# Patient Record
Sex: Female | Born: 1950 | Race: White | Hispanic: No | Smoking: Never smoker
Health system: Southern US, Community
[De-identification: ages and names within clinical notes are randomized; demographics above are authoritative.]

## PROBLEM LIST (undated history)

## (undated) DIAGNOSIS — R0600 Dyspnea, unspecified: Secondary | ICD-10-CM

## (undated) DIAGNOSIS — F329 Major depressive disorder, single episode, unspecified: Secondary | ICD-10-CM

## (undated) DIAGNOSIS — E119 Type 2 diabetes mellitus without complications: Secondary | ICD-10-CM

## (undated) DIAGNOSIS — R609 Edema, unspecified: Secondary | ICD-10-CM

## (undated) DIAGNOSIS — I10 Essential (primary) hypertension: Secondary | ICD-10-CM

## (undated) DIAGNOSIS — R011 Cardiac murmur, unspecified: Secondary | ICD-10-CM

## (undated) DIAGNOSIS — R233 Spontaneous ecchymoses: Secondary | ICD-10-CM

## (undated) DIAGNOSIS — K5190 Ulcerative colitis, unspecified, without complications: Secondary | ICD-10-CM

## (undated) DIAGNOSIS — R238 Other skin changes: Secondary | ICD-10-CM

## (undated) DIAGNOSIS — K529 Noninfective gastroenteritis and colitis, unspecified: Secondary | ICD-10-CM

## (undated) DIAGNOSIS — I82409 Acute embolism and thrombosis of unspecified deep veins of unspecified lower extremity: Secondary | ICD-10-CM

## (undated) DIAGNOSIS — Z86718 Personal history of other venous thrombosis and embolism: Secondary | ICD-10-CM

## (undated) DIAGNOSIS — N179 Acute kidney failure, unspecified: Secondary | ICD-10-CM

## (undated) DIAGNOSIS — M1990 Unspecified osteoarthritis, unspecified site: Secondary | ICD-10-CM

## (undated) DIAGNOSIS — F32A Depression, unspecified: Secondary | ICD-10-CM

## (undated) DIAGNOSIS — E7800 Pure hypercholesterolemia, unspecified: Secondary | ICD-10-CM

## (undated) DIAGNOSIS — R0989 Other specified symptoms and signs involving the circulatory and respiratory systems: Secondary | ICD-10-CM

## (undated) HISTORY — PX: BREAST BIOPSY: SHX20

## (undated) HISTORY — DX: Major depressive disorder, single episode, unspecified: F32.9

## (undated) HISTORY — PX: EYE SURGERY: SHX253

## (undated) HISTORY — DX: Unspecified osteoarthritis, unspecified site: M19.90

## (undated) HISTORY — PX: TONSILLECTOMY: SUR1361

## (undated) HISTORY — PX: OTHER SURGICAL HISTORY: SHX169

## (undated) HISTORY — DX: Type 2 diabetes mellitus without complications: E11.9

## (undated) HISTORY — DX: Depression, unspecified: F32.A

## (undated) HISTORY — DX: Spontaneous ecchymoses: R23.3

## (undated) HISTORY — DX: Edema, unspecified: R60.9

## (undated) HISTORY — DX: Personal history of other venous thrombosis and embolism: Z86.718

## (undated) HISTORY — PX: TUBAL LIGATION: SHX77

## (undated) HISTORY — PX: POSTERIOR FUSION LUMBAR SPINE: SUR632

## (undated) HISTORY — PX: LAMINECTOMY: SHX219

## (undated) HISTORY — DX: Essential (primary) hypertension: I10

## (undated) HISTORY — DX: Noninfective gastroenteritis and colitis, unspecified: K52.9

## (undated) HISTORY — DX: Other skin changes: R23.8

## (undated) HISTORY — PX: TOTAL HIP ARTHROPLASTY: SHX124

## (undated) HISTORY — PX: ANKLE FRACTURE SURGERY: SHX122

## (undated) HISTORY — PX: BACK SURGERY: SHX140

## (undated) HISTORY — DX: Other specified symptoms and signs involving the circulatory and respiratory systems: R09.89

---

## 2004-04-17 ENCOUNTER — Other Ambulatory Visit: Payer: Self-pay

## 2005-07-06 ENCOUNTER — Ambulatory Visit: Payer: Self-pay | Admitting: Internal Medicine

## 2005-07-12 ENCOUNTER — Ambulatory Visit: Payer: Self-pay

## 2005-11-08 ENCOUNTER — Emergency Department: Payer: Self-pay | Admitting: Unknown Physician Specialty

## 2006-07-19 ENCOUNTER — Ambulatory Visit: Payer: Self-pay | Admitting: Internal Medicine

## 2007-07-20 ENCOUNTER — Ambulatory Visit: Payer: Self-pay | Admitting: Internal Medicine

## 2007-07-25 ENCOUNTER — Ambulatory Visit: Payer: Self-pay | Admitting: Internal Medicine

## 2008-04-18 ENCOUNTER — Ambulatory Visit: Payer: Self-pay | Admitting: Internal Medicine

## 2008-07-26 ENCOUNTER — Ambulatory Visit: Payer: Self-pay | Admitting: Internal Medicine

## 2009-01-14 ENCOUNTER — Ambulatory Visit: Payer: Self-pay | Admitting: Unknown Physician Specialty

## 2009-02-03 ENCOUNTER — Ambulatory Visit: Payer: Self-pay | Admitting: Unknown Physician Specialty

## 2009-03-21 ENCOUNTER — Ambulatory Visit: Payer: Self-pay | Admitting: Unknown Physician Specialty

## 2009-04-05 ENCOUNTER — Ambulatory Visit: Payer: Self-pay | Admitting: Unknown Physician Specialty

## 2009-08-07 ENCOUNTER — Ambulatory Visit: Payer: Self-pay | Admitting: Internal Medicine

## 2009-08-13 ENCOUNTER — Ambulatory Visit: Payer: Self-pay | Admitting: Internal Medicine

## 2009-09-18 ENCOUNTER — Ambulatory Visit: Payer: Self-pay | Admitting: Surgery

## 2010-02-25 ENCOUNTER — Ambulatory Visit: Payer: Self-pay | Admitting: Surgery

## 2010-02-26 ENCOUNTER — Ambulatory Visit: Payer: Self-pay | Admitting: Unknown Physician Specialty

## 2010-03-06 ENCOUNTER — Ambulatory Visit: Payer: Self-pay | Admitting: Unknown Physician Specialty

## 2010-09-07 ENCOUNTER — Ambulatory Visit: Payer: Self-pay | Admitting: Internal Medicine

## 2011-02-04 ENCOUNTER — Ambulatory Visit: Payer: Self-pay | Admitting: Unknown Physician Specialty

## 2011-10-19 ENCOUNTER — Ambulatory Visit: Payer: Self-pay

## 2011-11-04 ENCOUNTER — Ambulatory Visit: Payer: Self-pay | Admitting: Pain Medicine

## 2011-11-22 ENCOUNTER — Ambulatory Visit: Payer: Self-pay | Admitting: Pain Medicine

## 2011-11-25 ENCOUNTER — Ambulatory Visit: Payer: Self-pay | Admitting: Pain Medicine

## 2012-02-17 ENCOUNTER — Ambulatory Visit: Payer: Self-pay | Admitting: Internal Medicine

## 2012-02-24 ENCOUNTER — Ambulatory Visit: Payer: Self-pay | Admitting: Internal Medicine

## 2012-04-28 DIAGNOSIS — M1610 Unilateral primary osteoarthritis, unspecified hip: Secondary | ICD-10-CM | POA: Insufficient documentation

## 2012-05-01 ENCOUNTER — Ambulatory Visit: Payer: Self-pay | Admitting: Unknown Physician Specialty

## 2012-05-03 LAB — PATHOLOGY REPORT

## 2012-05-31 DIAGNOSIS — M6281 Muscle weakness (generalized): Secondary | ICD-10-CM | POA: Insufficient documentation

## 2012-05-31 DIAGNOSIS — M5414 Radiculopathy, thoracic region: Secondary | ICD-10-CM | POA: Insufficient documentation

## 2012-05-31 DIAGNOSIS — R262 Difficulty in walking, not elsewhere classified: Secondary | ICD-10-CM | POA: Insufficient documentation

## 2012-06-20 DIAGNOSIS — M48061 Spinal stenosis, lumbar region without neurogenic claudication: Secondary | ICD-10-CM | POA: Insufficient documentation

## 2012-06-23 ENCOUNTER — Ambulatory Visit: Payer: Self-pay | Admitting: Physical Medicine & Rehabilitation

## 2012-07-11 DIAGNOSIS — M51379 Other intervertebral disc degeneration, lumbosacral region without mention of lumbar back pain or lower extremity pain: Secondary | ICD-10-CM | POA: Insufficient documentation

## 2012-07-11 DIAGNOSIS — M5137 Other intervertebral disc degeneration, lumbosacral region: Secondary | ICD-10-CM | POA: Insufficient documentation

## 2012-09-25 ENCOUNTER — Ambulatory Visit: Payer: Self-pay | Admitting: Internal Medicine

## 2012-10-09 ENCOUNTER — Ambulatory Visit: Payer: Self-pay | Admitting: Orthopedic Surgery

## 2012-10-25 DIAGNOSIS — M4310 Spondylolisthesis, site unspecified: Secondary | ICD-10-CM | POA: Insufficient documentation

## 2012-11-17 ENCOUNTER — Encounter: Payer: Self-pay | Admitting: Internal Medicine

## 2012-11-23 LAB — BASIC METABOLIC PANEL
Anion Gap: 7 (ref 7–16)
Chloride: 104 mmol/L (ref 98–107)
Co2: 25 mmol/L (ref 21–32)
EGFR (Non-African Amer.): 54 — ABNORMAL LOW
Glucose: 149 mg/dL — ABNORMAL HIGH (ref 65–99)
Osmolality: 277 (ref 275–301)
Potassium: 3.9 mmol/L (ref 3.5–5.1)
Sodium: 136 mmol/L (ref 136–145)

## 2012-12-06 ENCOUNTER — Encounter: Payer: Self-pay | Admitting: Internal Medicine

## 2012-12-24 LAB — COMPREHENSIVE METABOLIC PANEL
Alkaline Phosphatase: 140 U/L — ABNORMAL HIGH (ref 50–136)
Anion Gap: 8 (ref 7–16)
BUN: 36 mg/dL — ABNORMAL HIGH (ref 7–18)
Calcium, Total: 9 mg/dL (ref 8.5–10.1)
Chloride: 99 mmol/L (ref 98–107)
Co2: 25 mmol/L (ref 21–32)
Creatinine: 2.72 mg/dL — ABNORMAL HIGH (ref 0.60–1.30)
EGFR (Non-African Amer.): 18 — ABNORMAL LOW
Glucose: 137 mg/dL — ABNORMAL HIGH (ref 65–99)
Osmolality: 275 (ref 275–301)
Potassium: 4.5 mmol/L (ref 3.5–5.1)
SGPT (ALT): 14 U/L (ref 12–78)
Sodium: 132 mmol/L — ABNORMAL LOW (ref 136–145)
Total Protein: 7.6 g/dL (ref 6.4–8.2)

## 2012-12-24 LAB — URINALYSIS, COMPLETE
Blood: NEGATIVE
Hyaline Cast: 5
Ketone: NEGATIVE
Nitrite: NEGATIVE
Ph: 5 (ref 4.5–8.0)
Specific Gravity: 1.014 (ref 1.003–1.030)
Squamous Epithelial: 11
WBC UR: 39 /HPF (ref 0–5)

## 2012-12-24 LAB — CBC
MCHC: 31.6 g/dL — ABNORMAL LOW (ref 32.0–36.0)
MCV: 86 fL (ref 80–100)
RBC: 3.72 10*6/uL — ABNORMAL LOW (ref 3.80–5.20)
RDW: 15.5 % — ABNORMAL HIGH (ref 11.5–14.5)

## 2012-12-24 LAB — TROPONIN I: Troponin-I: <0.02 ng/mL

## 2012-12-25 ENCOUNTER — Inpatient Hospital Stay: Payer: Self-pay | Admitting: Internal Medicine

## 2012-12-25 LAB — BASIC METABOLIC PANEL
Anion Gap: 11 (ref 7–16)
BUN: 33 mg/dL — ABNORMAL HIGH (ref 7–18)
Chloride: 103 mmol/L (ref 98–107)
Co2: 21 mmol/L (ref 21–32)
EGFR (African American): 30 — ABNORMAL LOW
Glucose: 164 mg/dL — ABNORMAL HIGH (ref 65–99)
Potassium: 4.5 mmol/L (ref 3.5–5.1)
Sodium: 135 mmol/L — ABNORMAL LOW (ref 136–145)

## 2012-12-25 LAB — SODIUM, URINE, RANDOM: Sodium, Urine Random: 44 mmol/L (ref 20–110)

## 2012-12-26 LAB — BASIC METABOLIC PANEL
Anion Gap: 9 (ref 7–16)
Calcium, Total: 8.6 mg/dL (ref 8.5–10.1)
Co2: 22 mmol/L (ref 21–32)
Creatinine: 1.08 mg/dL (ref 0.60–1.30)
EGFR (African American): >60
Glucose: 110 mg/dL — ABNORMAL HIGH (ref 65–99)
Potassium: 4.1 mmol/L (ref 3.5–5.1)

## 2012-12-26 LAB — CBC WITH DIFFERENTIAL/PLATELET
Basophil %: 0.2 %
Eosinophil #: 0.4 10*3/uL (ref 0.0–0.7)
Eosinophil %: 6.5 %
HCT: 28.8 % — ABNORMAL LOW (ref 35.0–47.0)
HGB: 9.5 g/dL — ABNORMAL LOW (ref 12.0–16.0)
Lymphocyte %: 27.8 %
MCHC: 32.9 g/dL (ref 32.0–36.0)
Monocyte #: 0.8 x10 3/mm (ref 0.2–0.9)
Monocyte %: 13.4 %
Neutrophil #: 3.1 10*3/uL (ref 1.4–6.5)
Platelet: 321 10*3/uL (ref 150–440)

## 2012-12-26 LAB — URINE CULTURE

## 2012-12-27 LAB — CBC WITH DIFFERENTIAL/PLATELET
Basophil #: 0 10*3/uL (ref 0.0–0.1)
Basophil %: 0.3 %
Eosinophil #: 0.6 10*3/uL (ref 0.0–0.7)
Eosinophil %: 8.3 %
Lymphocyte #: 2.3 10*3/uL (ref 1.0–3.6)
MCH: 28.2 pg (ref 26.0–34.0)
MCHC: 33.5 g/dL (ref 32.0–36.0)
MCV: 84 fL (ref 80–100)
Monocyte %: 11.2 %
RDW: 15.5 % — ABNORMAL HIGH (ref 11.5–14.5)

## 2012-12-27 LAB — BASIC METABOLIC PANEL
Anion Gap: 9 (ref 7–16)
Chloride: 109 mmol/L — ABNORMAL HIGH (ref 98–107)
Co2: 23 mmol/L (ref 21–32)
EGFR (African American): >60
EGFR (Non-African Amer.): >60
Osmolality: 280 (ref 275–301)
Potassium: 3.8 mmol/L (ref 3.5–5.1)
Sodium: 141 mmol/L (ref 136–145)

## 2012-12-28 ENCOUNTER — Encounter: Payer: Self-pay | Admitting: Internal Medicine

## 2012-12-30 LAB — CULTURE, BLOOD (SINGLE)

## 2013-01-06 ENCOUNTER — Encounter: Payer: Self-pay | Admitting: Internal Medicine

## 2013-01-17 ENCOUNTER — Inpatient Hospital Stay: Payer: Self-pay | Admitting: Internal Medicine

## 2013-01-17 LAB — COMPREHENSIVE METABOLIC PANEL
Albumin: 3.3 g/dL — ABNORMAL LOW (ref 3.4–5.0)
Alkaline Phosphatase: 161 U/L — ABNORMAL HIGH (ref 50–136)
BUN: 18 mg/dL (ref 7–18)
Bilirubin,Total: 0.3 mg/dL (ref 0.2–1.0)
Calcium, Total: 9.5 mg/dL (ref 8.5–10.1)
Chloride: 94 mmol/L — ABNORMAL LOW (ref 98–107)
Co2: 26 mmol/L (ref 21–32)
Creatinine: 1.34 mg/dL — ABNORMAL HIGH (ref 0.60–1.30)
EGFR (African American): 49 — ABNORMAL LOW
Glucose: 119 mg/dL — ABNORMAL HIGH (ref 65–99)
Osmolality: 262 (ref 275–301)
Potassium: 4 mmol/L (ref 3.5–5.1)
SGPT (ALT): 15 U/L (ref 12–78)
Total Protein: 8.7 g/dL — ABNORMAL HIGH (ref 6.4–8.2)

## 2013-01-17 LAB — URINALYSIS, COMPLETE
Bilirubin,UR: NEGATIVE
Glucose,UR: NEGATIVE mg/dL (ref 0–75)
Leukocyte Esterase: NEGATIVE
Nitrite: NEGATIVE
Ph: 5 (ref 4.5–8.0)
Protein: NEGATIVE

## 2013-01-17 LAB — DRUG SCREEN, URINE
MDMA (Ecstasy)Ur Screen: POSITIVE (ref ?–500)
Opiate, Ur Screen: POSITIVE (ref ?–300)
Phencyclidine (PCP) Ur S: NEGATIVE (ref ?–25)

## 2013-01-17 LAB — CK TOTAL AND CKMB (NOT AT ARMC): CK-MB: 1.1 ng/mL (ref 0.5–3.6)

## 2013-01-17 LAB — CBC
HCT: 33 % — ABNORMAL LOW (ref 35.0–47.0)
HGB: 10.7 g/dL — ABNORMAL LOW (ref 12.0–16.0)
MCHC: 32.2 g/dL (ref 32.0–36.0)
MCV: 83 fL (ref 80–100)
Platelet: 322 10*3/uL (ref 150–440)
RBC: 3.99 10*6/uL (ref 3.80–5.20)
RDW: 16.6 % — ABNORMAL HIGH (ref 11.5–14.5)
WBC: 8.8 10*3/uL (ref 3.6–11.0)

## 2013-01-17 LAB — TROPONIN I: Troponin-I: <0.02 ng/mL

## 2013-01-18 LAB — CBC WITH DIFFERENTIAL/PLATELET
Basophil #: 0.1 10*3/uL (ref 0.0–0.1)
Basophil %: 1.2 %
Eosinophil #: 0.7 10*3/uL (ref 0.0–0.7)
Eosinophil %: 12.3 %
HCT: 28.4 % — ABNORMAL LOW (ref 35.0–47.0)
Lymphocyte #: 2.2 10*3/uL (ref 1.0–3.6)
Lymphocyte %: 36.6 %
MCH: 26.5 pg (ref 26.0–34.0)
MCV: 83 fL (ref 80–100)
Monocyte #: 0.7 x10 3/mm (ref 0.2–0.9)
Monocyte %: 12.4 %
Neutrophil #: 2.3 10*3/uL (ref 1.4–6.5)
Neutrophil %: 37.5 %
RDW: 17 % — ABNORMAL HIGH (ref 11.5–14.5)
WBC: 6 10*3/uL (ref 3.6–11.0)

## 2013-01-18 LAB — IRON AND TIBC
Iron Bind.Cap.(Total): 179 ug/dL — ABNORMAL LOW (ref 250–450)
Iron Saturation: 15 %
Unbound Iron-Bind.Cap.: 152 ug/dL

## 2013-01-18 LAB — BASIC METABOLIC PANEL
Anion Gap: 7 (ref 7–16)
Calcium, Total: 9 mg/dL (ref 8.5–10.1)
Chloride: 108 mmol/L — ABNORMAL HIGH (ref 98–107)
Co2: 25 mmol/L (ref 21–32)
Creatinine: 0.92 mg/dL (ref 0.60–1.30)
Glucose: 116 mg/dL — ABNORMAL HIGH (ref 65–99)
Osmolality: 280 (ref 275–301)
Potassium: 4.5 mmol/L (ref 3.5–5.1)
Sodium: 140 mmol/L (ref 136–145)

## 2013-01-18 LAB — RETICULOCYTES: Reticulocyte: 1.44 % (ref 0.5–2.2)

## 2013-01-18 LAB — MAGNESIUM: Magnesium: 1.5 mg/dL — ABNORMAL LOW

## 2013-01-19 LAB — BASIC METABOLIC PANEL
Co2: 25 mmol/L (ref 21–32)
Creatinine: 0.77 mg/dL (ref 0.60–1.30)
EGFR (African American): >60
EGFR (Non-African Amer.): >60
Osmolality: 279 (ref 275–301)
Potassium: 4.2 mmol/L (ref 3.5–5.1)
Sodium: 138 mmol/L (ref 136–145)

## 2013-01-19 LAB — HEMATOCRIT: HCT: 33.7 % — ABNORMAL LOW (ref 35.0–47.0)

## 2013-01-20 LAB — CBC WITH DIFFERENTIAL/PLATELET
Basophil #: 0.1 10*3/uL (ref 0.0–0.1)
Eosinophil %: 4.7 %
HCT: 33.8 % — ABNORMAL LOW (ref 35.0–47.0)
HGB: 11 g/dL — ABNORMAL LOW (ref 12.0–16.0)
Lymphocyte #: 2.8 10*3/uL (ref 1.0–3.6)
Lymphocyte %: 41 %
MCH: 26.6 pg (ref 26.0–34.0)
MCHC: 32.5 g/dL (ref 32.0–36.0)
Monocyte #: 0.6 x10 3/mm (ref 0.2–0.9)
Neutrophil %: 45.5 %
Platelet: 338 10*3/uL (ref 150–440)
RDW: 16.9 % — ABNORMAL HIGH (ref 11.5–14.5)
WBC: 6.9 10*3/uL (ref 3.6–11.0)

## 2013-01-20 LAB — BASIC METABOLIC PANEL
Anion Gap: 11 (ref 7–16)
BUN: 7 mg/dL (ref 7–18)
Calcium, Total: 9.7 mg/dL (ref 8.5–10.1)
Chloride: 103 mmol/L (ref 98–107)
Co2: 25 mmol/L (ref 21–32)
Glucose: 205 mg/dL — ABNORMAL HIGH (ref 65–99)
Osmolality: 281 (ref 275–301)

## 2013-01-22 LAB — BASIC METABOLIC PANEL
BUN: 11 mg/dL (ref 7–18)
Creatinine: 1.06 mg/dL (ref 0.60–1.30)
EGFR (African American): >60
EGFR (Non-African Amer.): 56 — ABNORMAL LOW
Glucose: 130 mg/dL — ABNORMAL HIGH (ref 65–99)
Osmolality: 277 (ref 275–301)
Sodium: 138 mmol/L (ref 136–145)

## 2013-01-22 LAB — CBC WITH DIFFERENTIAL/PLATELET
Basophil #: 0.1 10*3/uL (ref 0.0–0.1)
Basophil %: 1.1 %
Eosinophil #: 0.7 10*3/uL (ref 0.0–0.7)
Eosinophil %: 7.5 %
HGB: 10.4 g/dL — ABNORMAL LOW (ref 12.0–16.0)
MCH: 26.3 pg (ref 26.0–34.0)
MCHC: 32.2 g/dL (ref 32.0–36.0)
Monocyte %: 10.2 %
Platelet: 311 10*3/uL (ref 150–440)
RDW: 17 % — ABNORMAL HIGH (ref 11.5–14.5)

## 2013-02-03 ENCOUNTER — Encounter: Payer: Self-pay | Admitting: Internal Medicine

## 2013-03-06 ENCOUNTER — Encounter: Payer: Self-pay | Admitting: Internal Medicine

## 2013-03-20 DIAGNOSIS — T8579XA Infection and inflammatory reaction due to other internal prosthetic devices, implants and grafts, initial encounter: Secondary | ICD-10-CM | POA: Insufficient documentation

## 2013-03-25 ENCOUNTER — Emergency Department: Payer: Self-pay | Admitting: Emergency Medicine

## 2013-04-11 DIAGNOSIS — E7800 Pure hypercholesterolemia, unspecified: Secondary | ICD-10-CM | POA: Insufficient documentation

## 2013-04-11 DIAGNOSIS — I10 Essential (primary) hypertension: Secondary | ICD-10-CM | POA: Insufficient documentation

## 2013-05-25 ENCOUNTER — Ambulatory Visit: Payer: Self-pay | Admitting: Internal Medicine

## 2013-06-20 ENCOUNTER — Emergency Department: Payer: Self-pay | Admitting: Emergency Medicine

## 2013-06-20 LAB — COMPREHENSIVE METABOLIC PANEL
Albumin: 3.2 g/dL — ABNORMAL LOW (ref 3.4–5.0)
Anion Gap: 7 (ref 7–16)
BUN: 18 mg/dL (ref 7–18)
Bilirubin,Total: 0.3 mg/dL (ref 0.2–1.0)
Calcium, Total: 8.7 mg/dL (ref 8.5–10.1)
Co2: 23 mmol/L (ref 21–32)
Creatinine: 1.42 mg/dL — ABNORMAL HIGH (ref 0.60–1.30)
EGFR (African American): 46 — ABNORMAL LOW
Glucose: 229 mg/dL — ABNORMAL HIGH (ref 65–99)
Osmolality: 279 (ref 275–301)
Potassium: 4.3 mmol/L (ref 3.5–5.1)
SGOT(AST): 13 U/L — ABNORMAL LOW (ref 15–37)
SGPT (ALT): 13 U/L (ref 12–78)
Sodium: 135 mmol/L — ABNORMAL LOW (ref 136–145)

## 2013-06-20 LAB — URINALYSIS, COMPLETE
Bacteria: NONE SEEN
Protein: NEGATIVE
Specific Gravity: 1.012 (ref 1.003–1.030)
Squamous Epithelial: <1

## 2013-06-20 LAB — CBC
HCT: 33.8 % — ABNORMAL LOW (ref 35.0–47.0)
MCH: 27.8 pg (ref 26.0–34.0)
MCHC: 31.8 g/dL — ABNORMAL LOW (ref 32.0–36.0)
Platelet: 284 10*3/uL (ref 150–440)
RBC: 3.87 10*6/uL (ref 3.80–5.20)
RDW: 16.1 % — ABNORMAL HIGH (ref 11.5–14.5)
WBC: 14.9 10*3/uL — ABNORMAL HIGH (ref 3.6–11.0)

## 2013-06-20 LAB — TROPONIN I: Troponin-I: <0.02 ng/mL

## 2013-06-26 LAB — CULTURE, BLOOD (SINGLE)

## 2013-06-27 ENCOUNTER — Encounter: Payer: Self-pay | Admitting: Internal Medicine

## 2013-06-28 LAB — PROTIME-INR: INR: 1

## 2013-06-28 LAB — BASIC METABOLIC PANEL
Anion Gap: 6 — ABNORMAL LOW (ref 7–16)
BUN: 7 mg/dL (ref 7–18)
Calcium, Total: 8.5 mg/dL (ref 8.5–10.1)
Chloride: 104 mmol/L (ref 98–107)
Co2: 31 mmol/L (ref 21–32)
Creatinine: 1.31 mg/dL — ABNORMAL HIGH (ref 0.60–1.30)
EGFR (Non-African Amer.): 44 — ABNORMAL LOW
Osmolality: 280 (ref 275–301)
Potassium: 3.4 mmol/L — ABNORMAL LOW (ref 3.5–5.1)
Sodium: 141 mmol/L (ref 136–145)

## 2013-07-02 LAB — CBC WITH DIFFERENTIAL/PLATELET
Basophil #: 0.1 10*3/uL (ref 0.0–0.1)
Eosinophil #: 1.2 10*3/uL — ABNORMAL HIGH (ref 0.0–0.7)
HCT: 28.1 % — ABNORMAL LOW (ref 35.0–47.0)
HGB: 9.3 g/dL — ABNORMAL LOW (ref 12.0–16.0)
Lymphocyte %: 29 %
MCH: 29.4 pg (ref 26.0–34.0)
MCHC: 33 g/dL (ref 32.0–36.0)
MCV: 89 fL (ref 80–100)
Monocyte #: 0.4 x10 3/mm (ref 0.2–0.9)
Neutrophil #: 4.8 10*3/uL (ref 1.4–6.5)
Neutrophil %: 52.1 %
RBC: 3.15 10*6/uL — ABNORMAL LOW (ref 3.80–5.20)
RDW: 16.4 % — ABNORMAL HIGH (ref 11.5–14.5)
WBC: 9.2 10*3/uL (ref 3.6–11.0)

## 2013-07-02 LAB — BASIC METABOLIC PANEL
Anion Gap: 5 — ABNORMAL LOW (ref 7–16)
BUN: 14 mg/dL (ref 7–18)
Calcium, Total: 9 mg/dL (ref 8.5–10.1)
Chloride: 108 mmol/L — ABNORMAL HIGH (ref 98–107)
Co2: 28 mmol/L (ref 21–32)
Creatinine: 1.39 mg/dL — ABNORMAL HIGH (ref 0.60–1.30)
EGFR (Non-African Amer.): 41 — ABNORMAL LOW
Glucose: 145 mg/dL — ABNORMAL HIGH (ref 65–99)
Osmolality: 284 (ref 275–301)

## 2013-07-02 LAB — VANCOMYCIN, TROUGH: Vancomycin, Trough: 23 ug/mL (ref 10–20)

## 2013-07-05 LAB — BASIC METABOLIC PANEL
Anion Gap: 4 — ABNORMAL LOW (ref 7–16)
BUN: 14 mg/dL (ref 7–18)
Calcium, Total: 8.9 mg/dL (ref 8.5–10.1)
Co2: 29 mmol/L (ref 21–32)
Creatinine: 1.33 mg/dL — ABNORMAL HIGH (ref 0.60–1.30)
EGFR (African American): 50 — ABNORMAL LOW
Potassium: 4 mmol/L (ref 3.5–5.1)

## 2013-07-06 ENCOUNTER — Encounter: Payer: Self-pay | Admitting: Internal Medicine

## 2013-07-06 LAB — VANCOMYCIN, TROUGH: Vancomycin, Trough: 19 ug/mL (ref 10–20)

## 2013-07-09 LAB — BASIC METABOLIC PANEL
Anion Gap: 7 (ref 7–16)
Chloride: 105 mmol/L (ref 98–107)
Co2: 26 mmol/L (ref 21–32)
Creatinine: 1.21 mg/dL (ref 0.60–1.30)
EGFR (African American): 56 — ABNORMAL LOW
EGFR (Non-African Amer.): 48 — ABNORMAL LOW
Potassium: 3.8 mmol/L (ref 3.5–5.1)

## 2013-07-09 LAB — CBC WITH DIFFERENTIAL/PLATELET
HCT: 25.7 % — ABNORMAL LOW (ref 35.0–47.0)
Lymphocyte #: 2.4 10*3/uL (ref 1.0–3.6)
Lymphocyte %: 35.3 %
MCH: 30.3 pg (ref 26.0–34.0)
Monocyte #: 0.5 x10 3/mm (ref 0.2–0.9)
Neutrophil #: 2.8 10*3/uL (ref 1.4–6.5)
Neutrophil %: 41.7 %
RBC: 2.88 10*6/uL — ABNORMAL LOW (ref 3.80–5.20)
RDW: 16.8 % — ABNORMAL HIGH (ref 11.5–14.5)
WBC: 6.7 10*3/uL (ref 3.6–11.0)

## 2013-07-09 LAB — PROTIME-INR: Prothrombin Time: 24.2 secs — ABNORMAL HIGH (ref 11.5–14.7)

## 2013-07-09 LAB — SEDIMENTATION RATE: Erythrocyte Sed Rate: 52 mm/hr — ABNORMAL HIGH (ref 0–30)

## 2013-07-11 LAB — BASIC METABOLIC PANEL
Chloride: 106 mmol/L (ref 98–107)
Co2: 24 mmol/L (ref 21–32)
Creatinine: 1.16 mg/dL (ref 0.60–1.30)
EGFR (African American): 58 — ABNORMAL LOW
EGFR (Non-African Amer.): 50 — ABNORMAL LOW
Glucose: 213 mg/dL — ABNORMAL HIGH (ref 65–99)
Osmolality: 283 (ref 275–301)
Potassium: 3.9 mmol/L (ref 3.5–5.1)
Sodium: 138 mmol/L (ref 136–145)

## 2013-07-11 LAB — SEDIMENTATION RATE: Erythrocyte Sed Rate: 98 mm/hr — ABNORMAL HIGH (ref 0–30)

## 2013-07-11 LAB — CBC WITH DIFFERENTIAL/PLATELET
Basophil %: 1.2 %
Eosinophil #: 1 10*3/uL — ABNORMAL HIGH (ref 0.0–0.7)
Eosinophil %: 15.5 %
HGB: 7.5 g/dL — ABNORMAL LOW (ref 12.0–16.0)
Lymphocyte #: 1.6 10*3/uL (ref 1.0–3.6)
Lymphocyte %: 24.1 %
MCHC: 33.5 g/dL (ref 32.0–36.0)
MCV: 90 fL (ref 80–100)
Monocyte #: 0.6 x10 3/mm (ref 0.2–0.9)
Monocyte %: 9.1 %
Platelet: 237 10*3/uL (ref 150–440)
RBC: 2.5 10*6/uL — ABNORMAL LOW (ref 3.80–5.20)
RDW: 17 % — ABNORMAL HIGH (ref 11.5–14.5)
WBC: 6.7 10*3/uL (ref 3.6–11.0)

## 2013-07-11 LAB — VANCOMYCIN, TROUGH: Vancomycin, Trough: 18 ug/mL (ref 10–20)

## 2013-07-12 LAB — BASIC METABOLIC PANEL
Anion Gap: 6 — ABNORMAL LOW (ref 7–16)
Creatinine: 1.26 mg/dL (ref 0.60–1.30)
Glucose: 160 mg/dL — ABNORMAL HIGH (ref 65–99)
Osmolality: 280 (ref 275–301)
Potassium: 4 mmol/L (ref 3.5–5.1)
Sodium: 138 mmol/L (ref 136–145)

## 2013-07-14 LAB — CBC WITH DIFFERENTIAL/PLATELET
Eosinophil #: 1 10*3/uL — ABNORMAL HIGH (ref 0.0–0.7)
Eosinophil %: 15.8 %
HCT: 28.6 % — ABNORMAL LOW (ref 35.0–47.0)
HGB: 9.5 g/dL — ABNORMAL LOW (ref 12.0–16.0)
Lymphocyte #: 1.6 10*3/uL (ref 1.0–3.6)
Lymphocyte %: 26.5 %
MCV: 90 fL (ref 80–100)
Monocyte #: 0.5 x10 3/mm (ref 0.2–0.9)
Monocyte %: 7.9 %
Neutrophil #: 2.9 10*3/uL (ref 1.4–6.5)
Neutrophil %: 48.8 %
Platelet: 207 10*3/uL (ref 150–440)

## 2013-07-16 LAB — PROTIME-INR: INR: 2

## 2013-07-16 LAB — CBC WITH DIFFERENTIAL/PLATELET
Basophil %: 0.9 %
Eosinophil #: 1.1 10*3/uL — ABNORMAL HIGH (ref 0.0–0.7)
Eosinophil %: 16.8 %
HCT: 28.4 % — ABNORMAL LOW (ref 35.0–47.0)
HGB: 9.5 g/dL — ABNORMAL LOW (ref 12.0–16.0)
Lymphocyte %: 25.4 %
MCV: 90 fL (ref 80–100)
Monocyte %: 10.6 %
Neutrophil %: 46.3 %
Platelet: 174 10*3/uL (ref 150–440)
RBC: 3.17 10*6/uL — ABNORMAL LOW (ref 3.80–5.20)
RDW: 16.6 % — ABNORMAL HIGH (ref 11.5–14.5)
WBC: 6.4 10*3/uL (ref 3.6–11.0)

## 2013-07-19 LAB — BASIC METABOLIC PANEL
BUN: 16 mg/dL (ref 7–18)
Calcium, Total: 8.3 mg/dL — ABNORMAL LOW (ref 8.5–10.1)
Chloride: 106 mmol/L (ref 98–107)
Osmolality: 281 (ref 275–301)
Potassium: 3.7 mmol/L (ref 3.5–5.1)
Sodium: 139 mmol/L (ref 136–145)

## 2013-07-19 LAB — CREATININE, SERUM: EGFR (African American): 57 — ABNORMAL LOW

## 2013-07-23 LAB — VANCOMYCIN, TROUGH: Vancomycin, Trough: 15 ug/mL (ref 10–20)

## 2013-07-24 LAB — PROTIME-INR: INR: 3.3

## 2013-07-31 LAB — PROTIME-INR: INR: 4.4

## 2013-08-02 LAB — PROTIME-INR
INR: 2.5
Prothrombin Time: 26.7 secs — ABNORMAL HIGH (ref 11.5–14.7)

## 2013-08-06 ENCOUNTER — Encounter: Payer: Self-pay | Admitting: Internal Medicine

## 2013-12-07 ENCOUNTER — Emergency Department: Payer: Self-pay | Admitting: Emergency Medicine

## 2014-04-04 DIAGNOSIS — I80209 Phlebitis and thrombophlebitis of unspecified deep vessels of unspecified lower extremity: Secondary | ICD-10-CM | POA: Insufficient documentation

## 2014-04-18 DIAGNOSIS — S42213A Unspecified displaced fracture of surgical neck of unspecified humerus, initial encounter for closed fracture: Secondary | ICD-10-CM | POA: Insufficient documentation

## 2014-05-13 DIAGNOSIS — Z9889 Other specified postprocedural states: Secondary | ICD-10-CM | POA: Insufficient documentation

## 2014-05-30 ENCOUNTER — Ambulatory Visit: Payer: Self-pay | Admitting: Internal Medicine

## 2014-07-30 ENCOUNTER — Ambulatory Visit: Payer: Self-pay

## 2014-08-04 IMAGING — CR DG CHEST 1V PORT
1 series · 1 of 1 positions shown · non-contrast
Comparison: none

REASON FOR EXAM: post picc line placement
COMMENTS:

PROCEDURE:     DXR - DXR PORTABLE CHEST SINGLE VIEW  - March 26, 2013  [DATE]
RESULT:     Comparison is made to prior study dated 03/25/2013.

[ap]
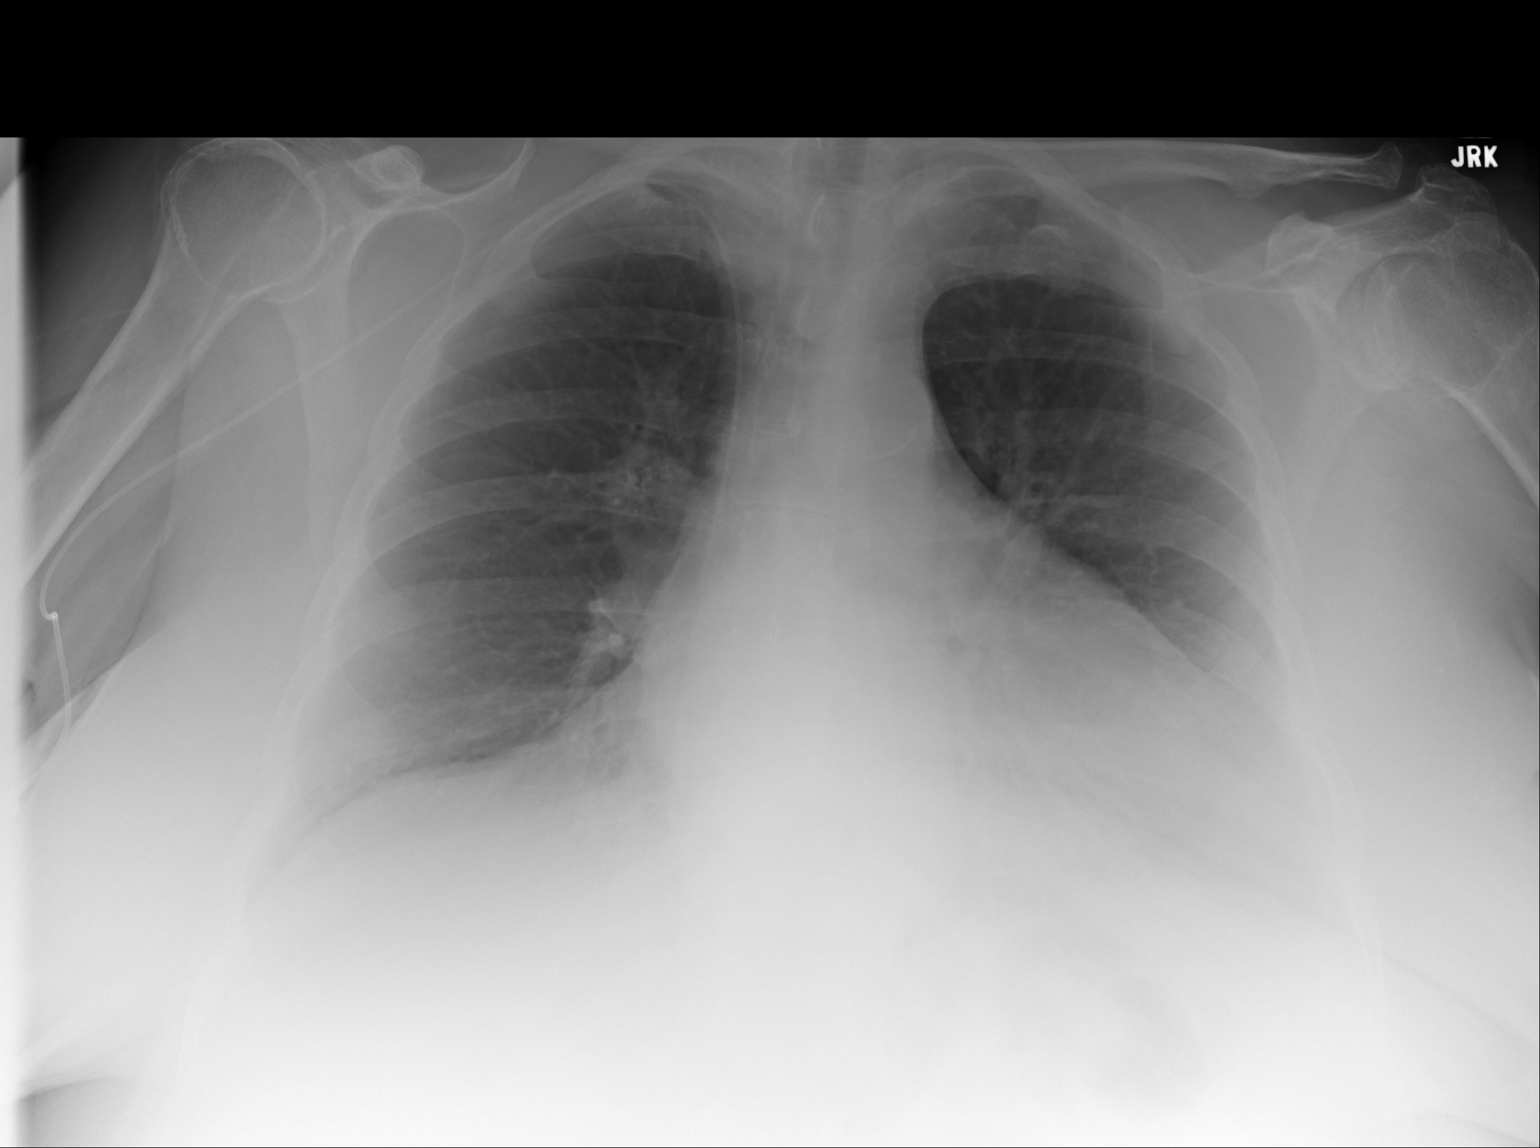

[1 of 1 positions shown; findings below may reference images not displayed]

FINDINGS: A right-sided central venous catheter is appreciated with tip
projecting in the region of the superior vena cava right atrial junction.
There is no evidence of pneumothorax. When compared to previous study there
is otherwise no significant change. There is no evidence of focal regions of
consolidation or focal infiltrates. The cardiac silhouette is moderately
enlarged. The visualized bony skeleton is unremarkable.
IMPRESSION: 1. Shallow inspiration.
2. Right-sided central venous catheter as described above. There is no
evidence of a pneumothorax.

## 2014-08-20 ENCOUNTER — Emergency Department: Payer: Self-pay | Admitting: Emergency Medicine

## 2014-08-20 LAB — URINALYSIS, COMPLETE
BLOOD: NEGATIVE
Bacteria: NONE SEEN
Bilirubin,UR: NEGATIVE
Glucose,UR: NEGATIVE mg/dL (ref 0–75)
Hyaline Cast: 4
Ketone: NEGATIVE
NITRITE: NEGATIVE
Ph: 5 (ref 4.5–8.0)
Protein: 30
Specific Gravity: 1.014 (ref 1.003–1.030)

## 2014-08-20 LAB — CBC
HCT: 37.6 % (ref 35.0–47.0)
HGB: 12.3 g/dL (ref 12.0–16.0)
MCH: 30.3 pg (ref 26.0–34.0)
MCHC: 32.8 g/dL (ref 32.0–36.0)
MCV: 92 fL (ref 80–100)
PLATELETS: 163 10*3/uL (ref 150–440)
RBC: 4.07 10*6/uL (ref 3.80–5.20)
RDW: 13.4 % (ref 11.5–14.5)
WBC: 7.4 10*3/uL (ref 3.6–11.0)

## 2014-08-20 LAB — COMPREHENSIVE METABOLIC PANEL
ALK PHOS: 121 U/L — AB
ALT: 27 U/L
Albumin: 3.2 g/dL — ABNORMAL LOW (ref 3.4–5.0)
Anion Gap: 12 (ref 7–16)
BUN: 45 mg/dL — ABNORMAL HIGH (ref 7–18)
Bilirubin,Total: 0.4 mg/dL (ref 0.2–1.0)
Calcium, Total: 8.5 mg/dL (ref 8.5–10.1)
Chloride: 104 mmol/L (ref 98–107)
Co2: 21 mmol/L (ref 21–32)
Creatinine: 1.92 mg/dL — ABNORMAL HIGH (ref 0.60–1.30)
EGFR (African American): 32 — ABNORMAL LOW
GFR CALC NON AF AMER: 27 — AB
GLUCOSE: 152 mg/dL — AB (ref 65–99)
OSMOLALITY: 288 (ref 275–301)
Potassium: 4.3 mmol/L (ref 3.5–5.1)
SGOT(AST): 24 U/L (ref 15–37)
SODIUM: 137 mmol/L (ref 136–145)
Total Protein: 7.2 g/dL (ref 6.4–8.2)

## 2014-08-20 LAB — PROTIME-INR
INR: 1.9
Prothrombin Time: 21 secs — ABNORMAL HIGH (ref 11.5–14.7)

## 2014-08-20 LAB — TROPONIN I: Troponin-I: <0.02 ng/mL

## 2015-03-28 NOTE — Discharge Summary (Signed)
PATIENT NAME:  Alexis Vaughan, Alexis Vaughan MR#:  [id number] DATE OF BIRTH:  07/25/1951  DATE OF ADMISSION:  12/25/2012  DATE OF DISCHARGE:  12/28/2012  FINAL DIAGNOSES: 1.  Acute renal failure secondary to dehydration.  2.  Accidental overdose of oxycodone and OxyContin.  3.  Right foot fracture, remains in boot, activity limited secondary to this.  4.  Osteoarthritis with severe back pain and disk disease, with impending surgery.  5.  Diverticulosis.  6.  Adult onset diabetes mellitus, insulin-requiring.  7.  Hypertension.  8.  Hyperlipidemia.  9.  History of right hip replacement.   HISTORY AND PHYSICAL: Please see dictated admission history and physical.   HOSPITAL COURSE: The patient was admitted with evidence of altered mental status. She was found to have evidence of acute renal failure and dehydration, which is thought to be causing the above. She also had taken more of her OxyContin than she was supposed to, it sounds like the dose had been changed, and the patient had become confused. In addition, she was found to have mild urinary tract infection. She was placed on IV fluids, and her renal function returned to normal. Her medications were initially held and then slowly reintroduced, and her mental status had cleared. She received a course of antibiotic treatment during her hospitalization and did not require this further.   She is limited in mobility secondary to her right foot fracture. She is currently in a boot, which she is allowed to take off when she is at rest, but she must use it when she is weight-bearing. Podiatry saw her here, and films were repeated, which showed the fracture, which does appear to be healing. She really has limits in her activity, unfortunately, secondary to severe pain in her right back and buttock from her degenerative disk disease. She has been seen by neurosurgery, and they have talked about surgical intervention for this. She has a followup with neurosurgery coming  up tomorrow, and they will readdress this in light of her foot fracture, with plan for her to proceed to surgery if this is what is felt is the next step, versus potentially revisiting physiatry to try to get some pain relief.   Her course has been more complicated by, unfortunately, the fact that her husband has been very ill, and was hospitalized himself; at this point, he is going for further evaluation for potentially a life-threatening illness, and this anxiety has contributed to the above. She has history of anxiety and depression already, followed by Dr. Maryruth Bun, psychiatrist; Dr. Maryruth Bun was gracious enough to see her during this hospitalization, medications were adjusted, and this did seem to help.   Due to the fact that she still is able to go only about 20 feet due to the pain and due to balance issues, mobility issues, as well as to help monitor medications, it was felt that she would do better returning to rehabilitation, so a bed search was undertaken. A bed became available, and the patient will be discharged to the nursing facility in stable condition with her physical activity to be up as tolerated with the boot on the right foot, although, again, she may remove this when she is nonweightbearing or lying in bed. Physical therapy and occupational therapy should evaluate and treat the patient. She will need her sugars checked b.i.d. with a physician called for more than 250 or less than 70. She should keep the followup with neurosurgery with Dr. Loistine Simas on Friday, January 24.   DISCHARGE  MEDICATIONS: 1.  Multivitamin 1 p.o. daily.  2. Remicade intravenously every 6 to 7 weeks; this is done through gastroenterology, Dr. Earnest ConroyElliott's office, and had been held more recently secondary to illness.  3.  Omeprazole 40 mg p.o. daily.  4.  Voltaren gel topically 4 times a day to the right knee.  5.  OxyContin 10 mg p.o. b.i.d.  6.  MiraLAX 17 grams p.o. daily.  7.  NovoLog 5 units subQ b.i.d., to be  given with breakfast and lunch.  8.  Pravastatin 80 mg at bedtime.  9.  Neurontin 900 mg at bedtime.  10.  Lantus 10 units subQ at bedtime.  11.  Oxycodone immediate release 5 mg p.o. q.4 hours p.r.n. severe pain.  12.  Trazodone 200 mg p.o. at bedtime.  13.  Venlafaxine 300 mg p.o. daily, extended release.  14.  Bupropion 75 mg p.o. daily.  15.  Lisinopril 20 mg p.o. daily.  16.  Colace 100 mg p.o. b.i.d. as needed for constipation.   Aspirin was held at this point given possibility of impending surgery. The patient previously on metformin 1000 mg p.o. b.i.d., and this was held due to her renal failure. If her sugars become more and issue, some of this could potentially be reinstituted, potentially at a lower dose.   CODE STATUS: During this patient's hospitalization, she desired NO CODE STATUS. She appeared to be of sound mind and was in her usual mental state when this decision was made, and so she will be DO NOT RESUSCITATE in accordance with her wishes. This was confirmed with family members. An out of facility DNR order was sent with her to the nursing facility.   TIME SPENT ON DISCHARGE: 45 minutes.    ____________________________ Lynnea FerrierBert J. Klein III, MD [id number] D: 12/28/2012 12:30:23 ET T: 12/28/2012 12:45:42 ET JOB#: [id number]  cc: Curtis SitesBert J. Klein III, MD, <Dictator> Everlene BallsJOE MINCHEW, MD Daniel NonesBERT KLEIN MD ELECTRONICALLY SIGNED 01/01/2013 19:58

## 2015-03-28 NOTE — Consult Note (Signed)
Referring Physician:  James Ivanoff, Roselie Awkward :   Primary Care Physician:  [facility], [address] [place], [place] [address], [place] [phone]  Reason for Consult: Admit Date: 17-Jan-2013  Chief Complaint: AMS  Reason for Consult: altered mental status   History of Present Illness: History of Present Illness:   History is gathered from the daughter and speaking with Dr. Caryl Comes due to patient's AMS initially and then additional history gathered from the patient as her AMS cleared. OF PRESENT ILLNESS: woman who I saw in clinic yesterday with a very complex medical history presents with altered mental status and hypotension.  Today I am unable to visit her in the hospital due to inpassable road conditions.  However, I have spoken with Dr. Caryl Comes regarding her care and have seen the patient less than 24 hour before in clinic.  As I have noted in my clinic note, this patient has a history of pain for which she takes narcotic pain medication.  She has been noted to have two recent hospitalizations this year for pain medication overuse with symptoms of somnolence with renal impairment.  On admission, the patient is once again exhibiting signs and symptoms of medication overuse.  Her renal function is down.  Her blood pressures on admission have been extremely low despite her history of HTN although I have noted that with a few liters of fluid her pressures have come back up this morning.  The symptoms are severe.  They are constant.  They appear to have been associated with excessive exposure to narcotic pain meds.  Decreased pain med use tends to make her symptoms better.  IVF have been making her symptoms better.     have noted that on visiting the patient later, her mental status is back to her baseline level.  She is alert and correctly answers all of my questions.  She tells me in detail how her husband has been admitted to another unit int he hospital for an infected port.    MEDICAL HISTORY: Chronic pain syndrome.  Narcotic use.  Diabetes.  Hyperlipidemia.  Hypertension.  Diverticulosis.  Chronic lower back pain  Degenerative disk disease of the back with impending surgery.  Osteoarthritis.  History of hip replacement.  SURGICAL HISTORY: Total right hip replacement.  Right foot surgery in December.   Left upper extremity surgery.  Back surgery.  SOCIAL HISTORY:  No tobacco, no alcohol, no drugs.  Living in a facility at present.  HISTORY: Unable to obtain due to altered mental status.   MEDICATIONS: Vitamin D3 2000 units once daily, venlafaxine 150 mg take 2 tablets once daily, Tylenol as needed for pain, trazodone 200 mg once at night, pravastatin 80 mg once a day, omeprazole 40 mg once a day, NovoLog insulin sliding scale and 5 units a day before breakfast and lunch, morphine sulfate SR 50 mg twice daily, MS Contin 30 mg 2 times a day extended release, Lantus 10 units subcutaneous every night, Lamictal 50 mg once daily, hydroxyzine p.r.n., gabapentin 600 mg 3 times a day, Fosinopril 20 mg once a day, docusate 100 mg twice daily, bupropion  75 mg twice daily.  No known drug allergies.   EXAM: GENERAL: NAD.  Normocephalic and atraumatic. exam shows normal disc size, appearance and C/D ratio without clear evidence of papilledema. and S2 sounds are within normal limits, without murmurs, gallops, or rubs. - Normal- NormalDrift - Absent bilaterally.- Unable to ambulate due to imbalance.  - Positive, able to stand  up but moderately unsteady without support.  Strength - 4+/5 throughout, muscle individually tested.   STATUS:is oriented to person, place and time.  Recent and remote memory are intact.  Attention span and concentration are intact.  Naming, repetition, comprehension and expressive speech are within normal limits.  Patient's fund of knowledge is within normal limits for educational level. NERVES:   CN II (normal visual acuity and visual fields)   CN III, IV, VI  (extraocular muscles are intact)   CN V (facial sensation is intact bilaterally)   CN VII (facial strength is intact bilaterally)   CN VIII (hearing is intact bilaterally)   CN IX/X (palate elevates midline, normal phonation)   CN XI (shoulder shrug strength is normal and symmetric)   CN XII (tongue protrudes midline)  There is decreased sensation in a length dependent fashion in her legs.  Otherwise, sensation is within normal limits.  1+/1+    Biceps 1+/1+    Brachioradialis  1+/1+    Patellar 1+/1+    Achilles to nose testing is within normal limits. MRI - personally viewed, unremarkable, global atrophy noted otherwise no strokes or focal abnormalities.  64 year old woman who I saw in clinic yesterday with a very complex medical history presents with altered mental status and hypotension.   this patient's AMS and hypotension appear most likely to be due to narcotic overuse and renal impairment and marked hypotension which are both likely the result of narcotic overuse.  I would recommend decreasing the amount of narcotic medication and managing her blood pressures closely.  Her recent examination was nonfocal besides the AMS.  Her MRI is unremarkable.  Would not recommend additional neurologic evaluation at this time such as EEG.  Would recommend that when patient's medical issues are resolved, then she should be seen in clinic for a formal cognitive disorders evaluation.  I have discussed with the daughter at length that an assessment in the setting of multiple medical confounding factors would be of limited accuracy and value and as such she should wait for a mental status evaluation when the patient's medication regimen, ARF, and blood pressure are normalized and she is at her baseline level in the outpatient setting.     visiting the patient later, I have noted that she is back to her baseline level with much improved cognitive function.  I would not recommend additional neurologic evaluation at  this time.  I have advised the patient against overusing strong narcotic pain medications as it seems she is sensitive to these meds and they are likely causing or contributing to her occasional bouts of renal impairment and hypotension as well.  I have noted that she has been started on a fentanyl patch to try to achieve a more consistent blood concentration of opioid exposure and I strongly agree with this plan. have personally viewed her HCT which is unmarkable and reviewed her labs and tests with her and answered her related questions.  Melrose Nakayama, MD  ROS:  General denies complaints    HEENT no complaints    Lungs no complaints    Cardiac no complaints    GI no complaints    GU no complaints    Musculoskeletal no complaints    Extremities no complaints    Skin no complaints    Endocrine no complaints    Past Medical/Surgical Hx:  depression:   hyper cholesterol:   COLITIS:   HTN:   Diabetes Mellitus,Type I (IDD):   Right shoulder sx:   left  leg surgery:   Right Total hip replacement: [facility]  Home Medications: Medication Instructions Last Modified Date/Time  Lamictal 25 mg oral tablet 1 tab(s) orally once a day for the first 2 weeks, then increase to  2 tablets (50 mg) once daily (0800) 12-Feb-14 13:21  omeprazole 40 mg oral delayed release capsule 1 cap(s) orally once a day for GERD (0600) 12-Feb-14 13:21  pravastatin 80 mg oral tablet 1 tab(s) orally once a day (at bedtime) for cholesterol (2100) 12-Feb-14 13:21  Lantus 100 units/mL subcutaneous solution 10 unit(s) subcutaneous once a day (at bedtime) for diabetes (2100) 12-Feb-14 13:21  fosinopril 20 mg oral tablet 1 tab(s) orally once a day for hypertension (0800) 12-Feb-14 13:21  trazodone 100 mg oral tablet 2 tab(s) (200 mg) orally once a day (at bedtime) for depression (2100) 12-Feb-14 13:21  venlafaxine 150 mg oral capsule, extended release 2 cap(s) (300 mg) orally once a day for depression (0800)  12-Feb-14 13:21  gabapentin 600 mg oral tablet 1 tab(s) orally 3 times a day (0800, 1300, 1700) 12-Feb-14 13:21  MS Contin 30 mg/12 hr oral tablet, extended release 1 tab(s) orally 2 times a day (0800, 2000) 12-Feb-14 13:21  docusate sodium 100 mg oral capsule 1 cap(s) orally 2 times a day, As needed, for stool softener 12-Feb-14 13:21  Tylenol 325 mg oral tablet 2 tab(s) (650 mg) orally every 4 hours, As Needed- for Pain or increased temperature 12-Feb-14 13:21  MSIR 15 milligram(s) orally 2 times a day, As Needed- for Pain (at least 4 hours apart) 12-Feb-14 13:21  Dextrose 50% injection 1 vial(s) intravenous once, As Needed for FSBS <60 12-Feb-14 13:21  GlucaGen recombinant 1 mg injectable powder for injection 1 milliliter(s) injectable once, As Needed for FSBS <60 12-Feb-14 13:21  NovoLog 100 units/mL subcutaneous solution unit(s) subcutaneous , As Needed per sliding scale: 61-80 = 4 ounces of juice, 81-175 = 0 units, 176-250 = 3 units, 251-325 = 6 units, 326-450 = 10 units, >451 = 12 units and recheck in 1 hour (call MD if >451 on 2 consecutive occasions) 12-Feb-14 13:21  hydrOXYzine hydrochloride 50 mg oral tablet 1 tab(s) orally 2 times a day, As Needed for anxiety 12-Feb-14 13:21  buPROPion 75 mg oral tablet 1 tab(s) orally 2 times a day for depression (0800, 1500) 12-Feb-14 13:21  Tab-A-Vite Multiple Vitamins oral tablet 1 tab(s) orally once a day for vitamin supplement (0800) 12-Feb-14 13:21  Vitamin D3 2000 intl units oral capsule 1 cap(s) orally once a day (0800) 12-Feb-14 13:21  NovoLog 100 units/mL subcutaneous solution 5 unit(s) subcutaneous 2 times a day (at breakfast and lunch) (0800, 1200) 12-Feb-14 13:21   [facility] Current Meds:  HePARin injection, 5000 unit(s), Subcutaneous, q12h  Indication: Anticoagulant, Monitor Anticoags per hospital protocol  Insulin SS -Novolog injection, Subcutaneous, FSBS before meals and at bedtime  0 units if FSBS 0-150     2 unit(s) if FSBS 151 -  200     4 unit(s) if FSBS 201 - 250     6 unit(s) if FSBS 251 - 300     8 unit(s) if FSBS 301 - 350     10 unit(s) if FSBS 351 - 400  Call MD if FSBS is greater than 400, [Waste Code: Black]  Acetaminophen * tablet, ( Tylenol (325 mg) tablet)  650 mg Oral q4h PRN for pain or temp. greater than 100.4  - Indication: Pain/Fever  Ondansetron injection, ( Zofran injection )  4 mg, IV push, q4h PRN  for Nausea/Vomiting  Indication: Nausea/ Vomiting  Pantoprazole tablet, 40 mg Oral q6am  - Indication: Erosive Esophagitis/ GERD  Instructions:  DO NOT CRUSH  Pravastatin tablet,  ( Pravachol)  80 mg Oral at bedtime  - Indication: Hypercholesterolemia  Venlafaxine XR capsule,  ( Effexor XR)  300 mg Oral at bedtime  - Indication: Depression/ General Anxiety Disorder  Haloperidol tablet,  ( Haldol)  1 to 2 mg Oral q6h PRN for psychosis  - Indication: Psychosis/ Delirium  Asenapine tablet,  ( Saphris tablet)  5 mg Sublingual at bedtime  buPROPion HCl tablet,  ( Wellbutrin (immediate release))  75 mg Oral <User Schedule> ( every 1 day: 08:00, 15:00 )  - Indication: Depression/ Smoking Cessation  lamoTRIgine tablet,  ( LaMICtal)  50 mg Oral daily  - Indication: Seizures/Mood Stabilizer  Docusate Sodium capsule,  ( Colace)  100 mg Oral bid  - Indication: Stool Softener  Ferrous Sulfate tablet,  ( FeSO4)  325 mg Oral bid/wm  - Indication: Iron Deficiency/ Anemia  Magnesium Sulfate injection, 2 gram in Sterile Water 50 ml, IV Piggyback, once, Infuse over 240 minute(s)  Indication: Magnesium Deficiency/ Pre-Eclampsia/ Eclampsia  Allergies:  No Known Allergies:   Vital Signs:  **Vital Signs.:   13-Feb-14 03:50  Vital Signs Type Routine  Temperature Temperature (F) 98.8  Celsius 37.1  Temperature Source oral  Pulse Pulse 82  Respirations Respirations 18  Systolic BP Systolic BP 106  Diastolic BP (mmHg) Diastolic BP (mmHg) 53  Mean BP 71  Pulse Ox % Pulse Ox % 95  Pulse Ox Activity  Level  At rest  Oxygen Delivery 2L    07:23  Vital Signs Type Routine  Temperature Temperature (F) 97.2  Celsius 36.2  Temperature Source oral  Pulse Pulse 92  Respirations Respirations 18  Systolic BP Systolic BP 269  Diastolic BP (mmHg) Diastolic BP (mmHg) 76  Mean BP 99  Pulse Ox % Pulse Ox % 94  Pulse Ox Activity Level  At rest  Oxygen Delivery 2L    12:27  Vital Signs Type Routine  Temperature Temperature (F) 97.5  Celsius 36.3  Temperature Source oral  Pulse Pulse 75  Systolic BP Systolic BP 485  Diastolic BP (mmHg) Diastolic BP (mmHg) 81  Mean BP 108  Pulse Ox % Pulse Ox % 96  Pulse Ox Activity Level  At rest  Oxygen Delivery 2L    16:20  Vital Signs Type Routine  Temperature Temperature (F) 97.8  Celsius 36.5  Temperature Source oral  Pulse Pulse 86  Respirations Respirations 20  Systolic BP Systolic BP 462  Diastolic BP (mmHg) Diastolic BP (mmHg) 77  Mean BP 102  Pulse Ox % Pulse Ox % 94  Pulse Ox Activity Level  At rest  Oxygen Delivery 2L    20:49  Vital Signs Type Routine  Temperature Temperature (F) 98.4  Celsius 36.8  Temperature Source oral  Pulse Pulse 81  Respirations Respirations 16  Systolic BP Systolic BP 703  Diastolic BP (mmHg) Diastolic BP (mmHg) 71  Mean BP 97  Pulse Ox % Pulse Ox % 97  Pulse Ox Activity Level  At rest  Oxygen Delivery 2L   Lab Results:  Hepatic:  12-Feb-14 13:06   Bilirubin, Total 0.3  Alkaline Phosphatase  161  SGPT (ALT) 15  SGOT (AST)  13  Total Protein, Serum  8.7  Albumin, Serum  3.3  Routine Micro:  12-Feb-14 13:06   Micro Text Report URINE CULTURE   COMMENT  NO GROWTH IN 8-12 HOURS   ANTIBIOTIC                       Culture Comment NO GROWTH IN 8-12 HOURS  Result(s) reported on 18 Jan 2013 at 08:10AM.  Specimen Source INDWELLING CATHETER    14:16   Micro Text Report BLOOD CULTURE   COMMENT                   NO GROWTH IN 8-12 HOURS   ANTIBIOTIC                       Culture  Comment NO GROWTH IN 8-12 HOURS  Result(s) reported on 18 Jan 2013 at 06:47AM.  Specimen Source #2 lt ac    14:17   Micro Text Report BLOOD CULTURE   COMMENT                   NO GROWTH IN 8-12 HOURS   ANTIBIOTIC                       Culture Comment NO GROWTH IN 8-12 HOURS  Result(s) reported on 18 Jan 2013 at 06:47AM.  Lab:  12-Feb-14 14:00   Lactic Acid, Cardiopulmonary 0.9 (Result(s) reported on 17 Jan 2013 at 02:11PM.)    15:50   pH (ABG)  7.28  PCO2  54  PO2 88  FiO2 28  Base Excess -2.2  HCO3 25.4  O2 Saturation 97.8  O2 Device [place]  Specimen Site (ABG) RT RADIAL  Specimen Type (ABG) ARTERIAL  Patient Temp (ABG) 37.0 (Result(s) reported on 17 Jan 2013 at 03:59PM.)  General Ref:  12-Feb-14 13:00   Cortisol, Serum RIA ========== TEST NAME ==========  ========= RESULTS =========  = REFERENCE RANGE =  CORTISOL  Cortisol Cortisol                        [   9.8 ug/dL            ]          2.3-19.4                                        Cortisol AM         6.2 - 19.4                                        Cortisol PM         2.3 - 11.9               [facility]            No: [id number]           [address], [place], [place] [address]           Lindon Romp, MD         [id number]   Result(s) reported on 18 Jan 2013 at 08:19AM.  Routine Chem:  12-Feb-14 13:06   Glucose, Serum  119  BUN 18  Creatinine (comp)  1.34  Sodium, Serum  129  Potassium, Serum 4.0  Chloride, Serum  94  CO2, Serum 26  Calcium (Total), Serum 9.5  Anion Gap 9  Osmolality (calc) 262  eGFR (African American)  49  eGFR (Non-African American)  42 (eGFR values <10m/min/1.73 m2 may be an indication of chronic kidney disease (CKD). Calculated eGFR is useful in patients with stable renal function. The eGFR calculation will not be reliable in acutely ill patients when serum creatinine is changing rapidly. It is not useful in  patients on dialysis. The eGFR calculation may not  be applicable to patients at the low and high extremes of body sizes, pregnant women, and vegetarians.)  13-Feb-14 04:00   Iron Binding Capacity (TIBC)  179  Unbound Iron Binding Capacity 152  Iron, Serum  27  Iron Saturation 15 (Result(s) reported on 18 Jan 2013 at 08:28AM.)    05:11   Magnesium, Serum  1.5 (1.8-2.4 THERAPEUTIC RANGE: 4-7 mg/dL TOXIC: > 10 mg/dL  -----------------------)  Glucose, Serum  116  BUN 12  Creatinine (comp) 0.92  Sodium, Serum 140  Potassium, Serum 4.5  Chloride, Serum  108  CO2, Serum 25  Calcium (Total), Serum 9.0  Anion Gap 7  Osmolality (calc) 280  eGFR (African American) >60  eGFR (Non-African American) >60 (eGFR values <633mmin/1.73 m2 may be an indication of chronic kidney disease (CKD). Calculated eGFR is useful in patients with stable renal function. The eGFR calculation will not be reliable in acutely ill patients when serum creatinine is changing rapidly. It is not useful in  patients on dialysis. The eGFR calculation may not be applicable to patients at the low and high extremes of body sizes, pregnant women, and vegetarians.)  14-Feb-14 07:57   Magnesium, Serum  1.5 (1.8-2.4 THERAPEUTIC RANGE: 4-7 mg/dL TOXIC: > 10 mg/dL  -----------------------)  Glucose, Serum  183  BUN 8  Creatinine (comp) 0.77  Sodium, Serum 138  Potassium, Serum 4.2  Chloride, Serum 105  CO2, Serum 25  Calcium (Total), Serum 9.3  Anion Gap 8  Osmolality (calc) 279  eGFR (Non-African American) >60 (eGFR values <6080min/1.73 m2 may be an indication of chronic kidney disease (CKD). Calculated eGFR is useful in patients with stable renal function. The eGFR calculation will not be reliable in acutely ill patients when serum creatinine is changing rapidly. It is not useful in  patients on dialysis. The eGFR calculation may not be applicable to patients at the low and high extremes of body sizes, pregnant women, and vegetarians.)  Urine Drugs:   12-50-DTO-67:12:45Tricyclic Antidepressant, Ur Qual (comp) NEGATIVE (Result(s) reported on 17 Jan 2013 at 02:21PM.)  Amphetamines, Urine Qual. NEGATIVE  MDMA, Urine Qual. POSITIVE  Cocaine Metabolite, Urine Qual. NEGATIVE  Opiate, Urine qual POSITIVE  Phencyclidine, Urine Qual. NEGATIVE  Cannabinoid, Urine Qual. NEGATIVE  Barbiturates, Urine Qual. NEGATIVE  Benzodiazepine, Urine Qual. NEGATIVE (----------------- The URINE DRUG SCREEN provides only a preliminary, unconfirmed analytical test result and should not be used for non-medical  purposes.  Clinical consideration and professional judgment should be  applied to any positive drug screen result due to possible interfering substances.  A more specific alternate chemical method must be used in order to obtain a confirmed analytical result.  Gas chromatography/mass spectrometry (GC/MS) is the preferred confirmatory method.)  Methadone, Urine Qual. NEGATIVE  Cardiac:  12-Feb-14 13:06   CK, Total 37  CPK-MB, Serum 1.1 (Result(s) reported on 17 Jan 2013 at 01:37PM.)  Troponin I < 0.02 (0.00-0.05 0.05 ng/mL or less: NEGATIVE  Repeat testing in 3-6 hrs  if clinically indicated. >0.05 ng/mL: POTENTIAL  MYOCARDIAL INJURY. Repeat  testing in 3-6 hrs if  clinically  indicated. NOTE: An increase or decrease  of 30% or more on serial  testing suggests a  clinically important change)  Routine UA:  12-Feb-14 13:06   Color (UA) YELLOW  Clarity (UA) HAZY  Glucose (UA) NEGATIVE  Bilirubin (UA) NEGATIVE  Ketones (UA) NEGATIVE  Specific Gravity (UA) 1.009  Blood (UA) NEGATIVE  pH (UA) 5.0  Protein (UA) NEGATIVE  Nitrite (UA) NEGATIVE  Leukocyte Esterase (UA) NEGATIVE (Result(s) reported on 17 Jan 2013 at 03:34PM.)  RBC (UA) 0-5  WBC (UA) 0-5  Bacteria (UA) 1+ (TRACE/FEW)  Epithelial Cells (UA) 0-5 / HPF  Hyaline Cast (UA) 5-15 / LPF  Result(s) reported on 17 Jan 2013 at 03:34PM.  Routine Sero:  13-Feb-14 13:09   Occult Blood,  Feces NEGATIVE (Result(s) reported on 18 Jan 2013 at 03:03PM.)  Routine Hem:  12-Feb-14 13:06   Hematocrit (CBC)  33.0  WBC (CBC) 8.8  RBC (CBC) 3.99  Hemoglobin (CBC)  10.7  Platelet Count (CBC) 322 (Result(s) reported on 17 Jan 2013 at 01:16PM.)  MCV 83  MCH 26.7  MCHC 32.2  RDW  16.6  13-Feb-14 05:11   Hematocrit (CBC)  28.4  Retic Count 1.44  Absolute Retic Count 0.0485 (Result(s) reported on 18 Jan 2013 at 09:33AM.)  WBC (CBC) 6.0  RBC (CBC)  3.43  Hemoglobin (CBC)  9.1  Platelet Count (CBC) 282  MCV 83  MCH 26.5  MCHC  31.9  RDW  17.0  Neutrophil % 37.5  Lymphocyte % 36.6  Monocyte % 12.4  Eosinophil % 12.3  Basophil % 1.2  Neutrophil # 2.3  Lymphocyte # 2.2  Monocyte # 0.7  Eosinophil # 0.7  Basophil # 0.1 (Result(s) reported on 18 Jan 2013 at 06:49AM.)  14-Feb-14 07:57   Hematocrit (CBC)  33.7 (Result(s) reported on 19 Jan 2013 at 08:20AM.)   Radiology Results: CT:    12-Feb-14 13:20, CT Head Without Contrast  CT Head Without Contrast   REASON FOR EXAM:    altered mental status  COMMENTS:       PROCEDURE: CT  - CT HEAD WITHOUT CONTRAST  - Jan 17 2013  1:20PM     RESULT: Technique: Helical 17m sections were obtained from the skull base   to the vertex without administration of intravenous contrast.     Findings: There is not evidence of intra-axial fluid collections. There   is no evidence of acute hemorrhage or secondary signs reflecting mass   effect or subacute or chronic focal territorial infarction. The osseous   structures demonstrate no evidence of a depressed skull fracture. If   there is persistent concern clinical follow-up with MRI is recommended.    IMPRESSION:   1. No evidence of acute intracranial abnormalities.     2. A comparison was made to prior study dated 12/24/2012.        Verified By: HMikki Santee M.D., MD   Electronic Signatures: PAnabel Bene(MD)  (Signed [id number])  Authored: REFERRING PHYSICIAN, Primary  Care Physician, Consult, History of Present Illness, Review of Systems, PAST MEDICAL/SURGICAL HISTORY, HOME MEDICATIONS, Current Medications, ALLERGIES, NURSING VITAL SIGNS, LAB RESULTS, RADIOLOGY RESULTS   Last Updated: 14-Feb-14 20:32 by PAnabel Bene(MD)

## 2015-03-28 NOTE — Consult Note (Addendum)
NOTE COMPLETED REMOTELY AS I WAS UNABLE TO SEE THE PATIENT TODAY DUE TO INCLEMENT WEATHER  AXIS I: Bipolar Disorder, Delirium, improving, History of eating disorder. II: Personality disorder, not otherwise specified. III: Hypertension, obesity, hyperlipidemia, diabetes, insulin-dependent diabetes, gastroesophageal reflux disease, ulcerative colitis, arthritis, recent foot surgery, chronic back pain. IV: Moderate. Conflict with daughter, chronic multiple medical problems, recurring surgery, husband with new onset medical problems and possible diagnosis of leukemia. V: GAF at present = 30  Alexis Vaughan is a 64 year old married Caucasian female with a history of Bipolar Disorder as well as multiple medical problems including diabetes, hypertension, hyperlipidemia and recent right foot surgery, who was admitted to the medicine floor with altered mental status, confusion and hypotension. The patient has been at [facility] over the past several weeks for PT after her foot surgery. The patient has been experiencing worsening problems with memory and confusion and was recently scheduled to see Neurology, Dr. Malvin JohnsPotter. She has been taking opiods due to chronic back pain as well as recent foot surgery and does appear to have a dependence on opiods. It is unclear whether or not the opiods have been contributing to memory problems or Alexis Vaughan or not. MRI Brain is currently ordered and pending. Due to recent agitation and behavioral problems, Alexis Vaughan was started on Lamictal for mood stabilization just two weeks ago. Will go ahead and add Sapprhis 5mg  po nightly for additional mood stabilization and prn Haldol if needed while in the hospital. Will continue Effexor XR 300mg  po daily for depression and low dose Wellbutrin 75mg  po BID to help with depression and focus and concentration. The patient is endorsing feelings of hopelessness over the past few months and has become increasingly dependent on staff at [facility]  and her family. She is very somatic and despite rehab has not been ambulating well. Her individual therapist, Kelby AlineJudith Bobo, did come out to [facility] to visit with her. The patient often sees herself as a victim and it has been extremely difficult to do any family therapy. She holds a lot of anger and resentment towards one of her daughters and unfortunately has not benefited from individual therapy in the past. She has expressed feelings of helplessness but no active suicidal thoughts. She does get mild psychotic symptoms in the context of delirium but otherwise no active psychotic symptoms in the past. More recently she has been struggling with increased confusion and memory problems. She is repeating statements and asking the same questions  over and over again. It is unclear if there is an organic etiology to the memory problems or they are being caused from the opiods.  Will await results of MRI Brain and neurology input. Once medically cleared will seek transfer to [facility] or [facility] as she needs further stabilization which has not been able to be accomplished as an outpatient and she is unable to care for herself.  Due to inclement weather I will not be in the hospital today but can be reached by cell at [phone]     Electronic Signatures: Caryn SectionKapur, Aarti (MD) (Signed on 13-Feb-14 09:32)  Authored   Last Updated: 13-Feb-14 09:58 by Caryn SectionKapur, Aarti (MD)

## 2015-03-28 NOTE — Consult Note (Signed)
PATIENT NAME:  Alexis Vaughan, Alexis Vaughan MR#:  [id number] DATE OF BIRTH:  July 12, 1951  DATE OF CONSULTATION:  12/26/2012  REFERRING PHYSICIAN:  Daniel Nones, MD CONSULTING PHYSICIAN:  Doralee Albino. Maryruth Bun, MD  REASON FOR CONSULTATION: Status post overdose on opioids and history of depression.   IDENTIFYING INFORMATION: The patient is a 64 year old married Caucasian female currently living in the [place] area with her husband. She has 2 daughters. One that lives in the [place] area and one [place]. She is currently retired for the past 3 years.   HISTORY OF PRESENT ILLNESS: The patient is a 64 year old married Caucasian female with a history of recurrent major depression and possible ADHD, as well as multiple medical problems including diabetes, hypertension, hyperlipidemia and recent right foot surgery, who was admitted to the medicine floor with altered mental status. Apparently the patient had just left a rehab facility approximately 5 days prior to admission, earlier than recommended because she was an extremely frustrated with having been there. When the patient got back to her house, she was taking pain medications prescribed by 3 different doctors; her foot surgeon Dr. Duffy Rhody in [place], her back doctor in [place] as well as Dr. Graciela Husbands. The patient had periods of confusion and delirium and was not eating or drinking properly. There was nobody at the house to supervise her medications. Her husband was at the house, but has been ill unfortunately. The patient was brought to the hospital with periods of confusion as well as psychosis over the past 24 hours. The patient's mental status has improved. She is now alert and communicative, but is still having some problems with confusion.  She knew the date was December 26, 2012, but had  difficulty naming presidents prior to Obama. She could do some serial 7s up to 86 and spell "world" backwards correctly. She did have difficulty with focus and concentration.  The patient was unable to remember the events at home after leaving the rehab facility and last remembers going to see the foot surgeon in [place]. The patient had apparently left the rehab facility and gone to see the surgeon in [place] before then returning home. She denies any worsening depressive symptoms, just frustration with having had been in a rehab facility for 5 to 6 weeks. Prior to the patient entering the rehab facility or having foot surgery in mid-December, her mood had been fairly stable, but she was having difficulty with worsening back problems. Apparently the patient is also awaiting a back surgery. She started using opioids in November and beginning of December secondary to worsening back pain. The patient herself currently denies any feelings of hopelessness, crying spells, suicidal thoughts or psychotic symptoms. She is currently denying any auditory or visual hallucinations. No current paranoid thoughts or delusions. She does have ongoing conflict with her daughter that lives in the mountains and the patient is continuously feeling like her daughter thinks that she needs additional psychiatric treatment. The patient sometimes fears that her daughter wants to have her involuntarily committed as an outpatient. The patient is followed by Dr. Maryruth Bun in our outpatient psychiatric office and has had no history of any psychosis or suicidal thoughts in the past. Prior the part of that, she is followed by Dr. Andee Poles for 5 years and had no history of any suicide attempt, inpatient psychiatric hospitalizations or psychosis. There has been no suspicion of the patient overusing medications in the past. She apparently started using additional opioids in the beginning of December, as  her back pain worsened.   PAST PSYCHIATRIC HISTORY: The patient is currently under the care of Dr. Maryruth Bun as an outpatient for psychotropic medication management, but prior to that was seeing Dr. Andee Poles in  [place] for the past 5 years. She also saw a therapist, Kelby Aline, for 4 to 5 years, but more recently was seeing [facility] [place] for individual therapy over the past 2 or 3 months. She has a prior diagnosis of major depression and had failed trials of Paxil and Pristiq. She had been doing well on a combination of Effexor and Wellbutrin.  It is still unclear whether or not the patient has a diagnosis of ADHD truly. She was on Adderall for ADHD  by her prior psychiatrist, Dr. Andee Poles.     FAMILY PSYCHIATRIC HISTORY: The patient reports that her daughter struggled with depression and had a history of an overdose in the past. She denies any other history of mental illness or substance abuse in the family.   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  Cataract surgery in January and February of 2013. Awaiting back surgery currently.  INPATIENT MEDICATIONS: Rocephin 1 gram IV q. 24 hour,  diclofenac gel 4 grams topical q.i.d., fosinopril 10 mg p.o. daily, omeprazole 40 mg p.o. daily, oxycodone extended release 10 mg p.o. every 12 hours, heparin 5000 units every 8 hours, Lantus insulin 10 units subcutaneous at bedtime, sliding scale insulin, oxycodone 5 mg every 4 hours p.r.n., Zofran p.r.n. The patient had all of her psychotropic medications discontinued at the time of admission to [facility]. When she was at [facility] during the months of December and part of January, she was maintained on the combination of Effexor, Wellbutrin and trazodone. The Effexor as an outpatient had been 300 mg p.o. daily, Wellbutrin 150 mg p.o. b.i.d. and trazodone 200 mg p.o. nightly.   SUBSTANCE ABUSE HISTORY: There is no history of any heavy alcohol use or illicit drug use including cocaine, cannabis, or stimulant use. The overuse of opioids started in end of  November beginning of December, as the patient's back pain worsened.  There had been no  prescription narcotic abuse in the past. No tobacco use currently or in the past.   SOCIAL HISTORY: The patient was born and raised in [place] area by both her biological parents. She says that her parents never divorced or separated. Both parents have passed away. She currently lives with her husband of 40 years in the [place] area and has two daughters, a 32 year old daughter and a 63 year old daughter. One daughter lives in [place] and one lives in the mountains. The patient has significant conflict with her daughter who lives in the mountains. The patient has a bachelor's degree in accounting from [employer] and worked in the past at [employer] for 26 years. 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 She denies any history of any physical or sexual abuse.   LEGAL HISTORY: She denies any history of any arrests or incarcerations.   MENTAL STATUS EXAM: The patient is a 64 year old, obese Caucasian female with short curly gray hair. She was fully alert and oriented to time and place, but not fully to situation and she had difficulty recalling events prior to admission. Speech is regular rate and rhythm, fluent and coherent, non-pressured. Thought processes were logical  and goal directed, although the patient had a difficult time answering questions related to the events prior to admission. She denied any current suicidal or homicidal thoughts. She denied any current auditory or visual hallucinations. She denied any paranoid thoughts or delusions. Attention and concentration were fair. Recall was 3 out of 3 initially and 2 out of 3 after 5 minutes. She had difficulty naming presidents backwards past Obama. She said Obama and then [place]. She was able to do serial 7s to 86. Abstraction was good. Cognition was grossly intact.   SUICIDE RISK ASSESSMENT: At  this time, the patient denies any current suicidal thoughts and her risk of harm to self and others is relatively low. However, if delirium or confusion worsens, her risk of harm to self and others will be more elevated.   LABORATORY DATA:  Sodium 141, potassium 4.1, chloride 110, CO2 22, BUN 16, creatinine 1.08. Creatinine on admission was 2.72 with a BUN of 36. White blood cell count on January 21 was 6.0, hemoglobin 9.5, platelet count 321. Blood culture showed no growth to date. Urinalysis showed 39 WBC, 1+ bacteria, 11 epithelial cells, 3+ leukocyte esterase.   DIAGNOSES:  1.  Delirium, improving.  2.  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  3.  History of eating disorder.   AXIS II: Personality disorder, not otherwise specified.  AXIS III: Hypertension, obesity, hyperlipidemia, diabetes, insulin-dependent diabetes, gastroesophageal reflux disease, ulcerative colitis, arthritis, recent foot surgery, chronic back pain.  AXIS IV: Moderate. Conflict with daughter, chronic multiple medical problems, recurring surgery, husband with new onset medical problems and possible diagnosis of leukemia.  AXIS V: GAF at present = 60.   ASSESSMENT AND TREATMENT RECOMMENDATIONS: The patient is a 64 year old married Caucasian female with a history of recurrent depression as well as a history of an eating disorder, who was admitted to the medicine service secondary to altered mental status. The patient had been overusing opioids at home, as she thought that she was supposed to take the opioids prescribed by 3 different doctors at the same time.  It does not appear as if there was a communication or reconciliation of the medications prior to the patient leaving [facility] or the patient ignored the instructions of [facility] and ended up taking multiple opioid prescriptions at home. I do not feel as if the patient was intentionally trying to harm herself or commit  suicide. It appears as if the state of altered mental status is most likely secondary to the opioids and as a result of not taking care of herself the patient had acute renal failure.  Mental status does appear to be improving but is still not at baseline. We will go ahead and plan to restart her outpatient psychotropic medications at half the dose that they were on an outpatient. Effexor-XR will be restarted at 150 mg p.o. daily with the plan to titrate back up to 300 mg p.o. daily. We will start Wellbutrin immediate release at 75 mg p.o. b.i.d. with the plan to titrate back up 150 mg p.o. b.i.d. We will go ahead and give trazodone 100 mg p.o. nightly for insomnia for now. At time this time, patient is not agitated or psychotic. If she does become psychotic, can use Risperdal 0.5 mg p.o. every 8 to 12 hours p.r.n. One of the medications that the daughter had realized the patient was taking at home was Risperdal.  This medication has not been prescribed to her by this provider and  it appears may have been prescribed to her postop when she was at [facility] after her foot surgery. The case was discussed with both of the patient's daughters and is important that not only are her medications reconciled before she leaves [facility] in the future, that her medications at home be supervised. It does not appear that the patient had problems with medication administration in the past, until her back pain worsened and she started taking the opioids in December, which caused confusion and then further mismanagement of her medications. Risks, benefits, and alternatives to treatment discussed with the patient and she consented to the treatment plan. We will continue to follow the patient on a daily basis while she is in the hospital.        ____________________________ Doralee Albino. Maryruth Bun, MD akk:cc D: 12/26/2012 18:37:26 ET T: 12/26/2012 19:47:05 ET JOB#: [id number]  cc: Aarti K. Maryruth Bun, MD, <Dictator> Darliss Ridgel MD ELECTRONICALLY SIGNED 12/29/2012 19:55

## 2015-03-28 NOTE — Discharge Summary (Signed)
PATIENT NAME:  Alexis Vaughan, Alexis Vaughan MR#:  [id number] DATE OF BIRTH:  03-03-1951  DATE OF ADMISSION:  01/17/2013 DATE OF DISCHARGE:  01/22/2013  FINAL DIAGNOSES: 1. Metabolic encephalopathy secondary to pain medication, psychiatric stress.  2. Hypotension secondary to #1.  3. Degenerative disk disease with chronic pain syndrome.  4. Adult onset diabetes mellitus, uncontrolled.  5. Hyperlipidemia.  6. Hypertension.  7. Osteoarthritis.   HISTORY AND PHYSICAL: Please see dictated admission history and physical.   HOSPITAL COURSE: The patient was admitted with altered mental status. She was found to have acute hypercapnic respiratory failure, hypotension, and this appeared to be a combination of use of pain medications, and significant social stressors, with her husband recently admitted with infection after ongoing treatment for leukemia. Her medications were adjusted and pain medications were decreased significantly, fortunately she tolerated this well. Psychiatry saw the patient, with additional medication adjustments made. Neurology saw the patient and MRI was performed, which revealed small vessel changes without acute issues.   Multiple discussions were held with the patient regarding use of pain medication, and she did fairly well with a simplified regimen with use of Duragesic patch. At this point it is evident that she continues to require skilled nursing care, and the plan will be for her to return to the skilled nursing facility to resume physical therapy for her back, and then follow up with neurosurgery to consider surgery in the near future. She should be up as tolerated with physical therapy, occupational therapy to evaluate and treat. No added salt, and no concentrated sweet diet. Follow up with Dr. Loistine SimasMinchew as scheduled. Follow up with the nursing home physician as planned. Check blood sugars before meals and follow sliding scale.   DISCHARGE MEDICATIONS: 1. Omeprazole 40 mg p.o. daily.   2. Pravastatin 80 mg p.o. at bedtime.  3. Lantus 10 units sub-Q at bedtime.  4. Fosinopril 20 mg p.o. daily. 5. Colace 100 mg p.o. b.i.d. as needed as stool softener. 6. Glucagon 1 mL injectable as needed for blood sugar less than 60.  7. NovoLog sliding scale insulin.  8. Multivitamin 1 p.o. daily.  9. Vitamin D 2000 units p.o. daily.  10. Neurontin 600 mg p.o. t.i.d.  11. NovoLog 5 units b.i.d. with breakfast and lunch. 12. Lamictal 50 mg p.o. daily.  13. Bupropion 75 mg p.o. daily.  14. Trazodone 100 mg p.o. at bedtime.  15. Venlafaxine XR 300 mg p.o. at bedtime.  16. Fentanyl patch 25 mcg topically q. 3 days.  17. Ferrous sulfate 325 mg p.o. b.i.d. for anemia.  18. Metformin 1000 mg p.o. b.i.d.  19. Asenapine 5 mg sublingually daily.  20. Toprol-XL 50 mg p.o. daily.  21. Skelaxin 800 mg p.o. t.i.d. as needed for muscle spasm or pain.  ____________________________ Lynnea FerrierBert J. Klein III, MD bjk:sb D: 01/22/2013 08:05:00 ET T: 01/22/2013 08:35:06 ET JOB#: [id number]  cc: Lynnea FerrierBert J. Klein III, MD, <Dictator> Daniel NonesBERT KLEIN MD ELECTRONICALLY SIGNED 01/22/2013 19:58

## 2015-03-28 NOTE — Consult Note (Signed)
PATIENT NAME:  Alexis Vaughan, Alexis Vaughan MR#:  [id number] DATE OF BIRTH:  [birth year]  DATE OF CONSULTATION:  12/26/2012  REFERRING PHYSICIAN:   CONSULTING PHYSICIAN:  Linus Galasodd Elsie Baynes, DPM  REASON FOR CONSULTATION: This is a 64 year old female recently admitted with an accidental overdose and confusion. The patient relates that recently she has been treated after a fall for a fracture in her right foot. She has a history of ankle fracture in the right as well. She has been in an air cast fracture boot and was recently told that she could begin putting full weight on the right foot in the fracture boot and take it off for bathing. The patient was then admitted due to the above circumstances.   PAST MEDICAL HISTORY:  Adult onset diabetes mellitus under reasonable control, acute renal failure, hypertension, hyperlipidemia.   PAST SURGICAL HISTORY:  Bilateral ORIF ankle fractures, right total hip replacement, right foot surgery, left upper extremity surgery.   MEDICATIONS:  Listed in the chart.   ALLERGIES: No known drug allergies.   REVIEW OF SYSTEMS: Denies any current swelling or discolorations in the feet. Does have some numbness and paresthesias.   PHYSICAL EXAMINATION:  EXTREMITIES:  Pulses grossly intact.  The patient is in a fracture boot in the bed. No focal edema is noted.  NEUROLOGICAL: Is not assessed.   X-RAYS:  Multiple views taken earlier today reveal a fracture through the proximal phalanx of the 4th toe. Some old degenerative changes in the 2nd metatarsal cuneiform area are noted, but no acute dislocation or fracture is noted there. Retained hardware is noted in the fibula.   ASSESSMENT: 1.  Fracture fourth toe, right foot.  2.  Arthritis mid foot.  3.  Previous ankle fracture open reduction and internal fixation.   PLAN:  I think the patient should be fine to be full weight-bearing on the right with her fracture boot. She may take it off for bathing and when she is in bed. Outpatient, she  may follow up with her regular podiatrist.      ____________________________ Linus Galasodd Gwendalynn Eckstrom, DPM tc:ct D: 12/26/2012 13:07:58 ET T: 12/26/2012 13:17:53 ET JOB#: [id number]  cc: Linus Galasodd Reannah Totten, DPM, <Dictator> Dailyn Reith DPM ELECTRONICALLY SIGNED 01/08/2013 10:29
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
## 2015-04-01 ENCOUNTER — Other Ambulatory Visit
Admit: 2015-04-01 | Disposition: A | Discharge status: Home / Self Care | Payer: Self-pay | Attending: Unknown Physician Specialty | Admitting: Unknown Physician Specialty

## 2015-04-01 LAB — CLOSTRIDIUM DIFFICILE(ARMC)

## 2015-04-04 LAB — STOOL CULTURE

## 2015-05-12 ENCOUNTER — Other Ambulatory Visit: Payer: Self-pay | Admitting: Internal Medicine

## 2015-05-12 DIAGNOSIS — Z1231 Encounter for screening mammogram for malignant neoplasm of breast: Secondary | ICD-10-CM

## 2015-05-12 DIAGNOSIS — E119 Type 2 diabetes mellitus without complications: Secondary | ICD-10-CM | POA: Insufficient documentation

## 2015-05-15 DIAGNOSIS — Z9660 Presence of unspecified orthopedic joint implant: Secondary | ICD-10-CM | POA: Insufficient documentation

## 2015-05-15 DIAGNOSIS — Z4889 Encounter for other specified surgical aftercare: Secondary | ICD-10-CM | POA: Insufficient documentation

## 2015-06-02 ENCOUNTER — Ambulatory Visit: Payer: Self-pay

## 2015-06-02 ENCOUNTER — Ambulatory Visit
Admission: RE | Admit: 2015-06-02 | Discharge: 2015-06-02 | Disposition: A | Discharge status: Home / Self Care | Payer: BLUE CROSS/BLUE SHIELD | Source: Ambulatory Visit | Attending: Internal Medicine | Admitting: Internal Medicine

## 2015-06-02 DIAGNOSIS — Z1231 Encounter for screening mammogram for malignant neoplasm of breast: Secondary | ICD-10-CM | POA: Insufficient documentation

## 2015-12-17 ENCOUNTER — Ambulatory Visit (INDEPENDENT_AMBULATORY_CARE_PROVIDER_SITE_OTHER): Payer: Medicare Other

## 2015-12-17 ENCOUNTER — Ambulatory Visit (INDEPENDENT_AMBULATORY_CARE_PROVIDER_SITE_OTHER): Payer: Medicare Other | Admitting: Podiatry

## 2015-12-17 ENCOUNTER — Encounter: Payer: Self-pay | Admitting: Podiatry

## 2015-12-17 VITALS — BP 117/66 | HR 75 | Resp 18

## 2015-12-17 DIAGNOSIS — M86171 Other acute osteomyelitis, right ankle and foot: Secondary | ICD-10-CM | POA: Diagnosis not present

## 2015-12-17 DIAGNOSIS — L89891 Pressure ulcer of other site, stage 1: Secondary | ICD-10-CM | POA: Diagnosis not present

## 2015-12-17 DIAGNOSIS — Z0189 Encounter for other specified special examinations: Secondary | ICD-10-CM

## 2015-12-17 DIAGNOSIS — E119 Type 2 diabetes mellitus without complications: Secondary | ICD-10-CM

## 2015-12-17 MED ORDER — AMOXICILLIN-POT CLAVULANATE 500-125 MG PO TABS: 1.0000 | ORAL_TABLET | Freq: Three times a day (TID) | ORAL | Status: DC

## 2015-12-17 MED ORDER — MUPIROCIN 2 % EX OINT: TOPICAL_OINTMENT | CUTANEOUS | Status: DC

## 2015-12-17 NOTE — Progress Notes (Signed)
   Subjective:    Patient ID: Alexis Vaughan, female    DOB: 06/24/1951, 65 y.o.   MRN: [id number]  HPI my right 5th toe is touchy and has been hurting for about 3 months and is sore and tender and there is no draining and i am a diabetic. She has been seen by Dr. Graciela HusbandsKlein who told her that he would have to do surgery on the toe corrected problem. She is currently taking antibiotics to help prevent any type of hip infection for her prosthetic hip. She states that Dr. Graciela HusbandsKlein also suggested that this was a fungal infection. She states that her toe hurts so bad she cannot have a sheet touch at nighttime.    Review of Systems  Constitutional: Positive for unexpected weight change.  All other systems reviewed and are negative.      Objective:   Physical Exam: 65 year old white female in no apparent distress vital signs stable alert and oriented 3 pulses are palpable. Neurologic sensorium is intact. Deep tendon reflexes are intact. Muscle strength is 5 over 5 dorsiflexion plantar flexors and inverters and everters on physical musculatures intact. Orthopedic evaluation demonstrates rectus foot type right with hammertoe deformities rigid in nature #4 #5 of the right foot. Radiographic evaluation does demonstrate what appears to be some radiolucency to the base of the fifth proximal phalanx medially with some periostitis this very well could be indicative of early osteomyelitis. The skin breakdown is in that vicinity. Cutaneous evaluation demonstrates superficial ulceration skin breakdown to a red painful toe without purulence and without malodor. She has a very deep sulcus with a very superficial wound that does not probe to bone.        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  Plan: Started her on Augmentin 500 mg 1 by mouth twice a day since she is already on doxycycline 100 mg  twice a day. I encouraged her to clean this daily and soak it with Epsom salts and water apply a small amount of Bactroban ointment which I also prescribed and utilize a Darco shoe. I will follow-up with her in 2-3 weeks for reevaluation. I did discuss the possibility of an MRI to rule out osteomyelitis.

## 2015-12-30 DIAGNOSIS — Z86718 Personal history of other venous thrombosis and embolism: Secondary | ICD-10-CM | POA: Insufficient documentation

## 2016-01-01 ENCOUNTER — Telehealth: Payer: Self-pay | Admitting: *Deleted

## 2016-01-01 MED ORDER — AMOXICILLIN-POT CLAVULANATE 500-125 MG PO TABS: 1.0000 | ORAL_TABLET | Freq: Three times a day (TID) | ORAL | Status: DC

## 2016-01-01 NOTE — Telephone Encounter (Signed)
Pt states she was on antibiotic for her little toe and she has completed them, but the toe is still red and inflamed, has an appt 01/05/2016.  Dr. Al Corpus states refill Augmentin as previously and make sure pt keeps her appt.  I informed pt and she will be there on Monday.

## 2016-01-07 ENCOUNTER — Ambulatory Visit (INDEPENDENT_AMBULATORY_CARE_PROVIDER_SITE_OTHER): Payer: Medicare Other | Admitting: Podiatry

## 2016-01-07 ENCOUNTER — Encounter: Payer: Self-pay | Admitting: Podiatry

## 2016-01-07 VITALS — BP 148/83 | HR 83 | Resp 18

## 2016-01-07 DIAGNOSIS — M86171 Other acute osteomyelitis, right ankle and foot: Secondary | ICD-10-CM

## 2016-01-07 MED ORDER — AMOXICILLIN-POT CLAVULANATE 500-125 MG PO TABS: 1.0000 | ORAL_TABLET | Freq: Three times a day (TID) | ORAL | Status: DC

## 2016-01-07 NOTE — Progress Notes (Signed)
She presents today for follow-up of a infection to the fifth digit of the right foot. The cellulitis is done much better and she says I feel that the foot is doing much better. He continues to take her antibiotics.  Objective: Vital signs are stable alert and oriented 3. Pulses are strongly palpable. Neurologic sensorium is intact. Fifth digit demonstrates mild erythema ulceration is still present to the medial border but has decreased in size.  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  Plan: Discussed surgical intervention regarding arthroplasty syndactylization for possible amputation of the toe depending on radiographs will be taken next visit. We continue her Augmentin and she will continue to take care of this on a daily basis.

## 2016-01-26 ENCOUNTER — Ambulatory Visit (INDEPENDENT_AMBULATORY_CARE_PROVIDER_SITE_OTHER): Payer: Medicare Other | Admitting: Podiatry

## 2016-01-26 ENCOUNTER — Encounter: Payer: Self-pay | Admitting: Podiatry

## 2016-01-26 VITALS — BP 121/66 | HR 70 | Resp 12

## 2016-01-26 DIAGNOSIS — L97514 Non-pressure chronic ulcer of other part of right foot with necrosis of bone: Secondary | ICD-10-CM

## 2016-01-26 MED ORDER — FLUCONAZOLE 150 MG PO TABS: 150.0000 mg | ORAL_TABLET | Freq: Once | ORAL | Status: DC

## 2016-01-26 MED ORDER — CLINDAMYCIN HCL 150 MG PO CAPS: 150.0000 mg | ORAL_CAPSULE | Freq: Three times a day (TID) | ORAL | Status: DC

## 2016-01-26 NOTE — Progress Notes (Signed)
She presents today for follow-up of her cellulitis medial aspect ulceration between the fourth and fifth digits of the right foot. She states this seems to be feeling much better and is looking much better.  Objective: Vital signs are stable she is alert and oriented 3. Pulses are palpable. Much decrease in erythema and edema since last visit and the ulceration appears to be healing.pulses remain palpable however I do think that she has some digital compromise.  Assessment:slowly healing ulcer lesion between the fourth and fifth digits of the right foot with mild cellulitis.  Plan: At this point I'm going to change her medicines clindamycin and we will send her for a vascular consult. I recommended that she continue to keep a spacer between the toes and I will follow up with her in 2 weeks.

## 2016-01-27 ENCOUNTER — Telehealth: Payer: Self-pay | Admitting: *Deleted

## 2016-01-27 DIAGNOSIS — Z789 Other specified health status: Secondary | ICD-10-CM

## 2016-01-27 NOTE — Telephone Encounter (Addendum)
-----   Message from Kristian Covey, [facility] sent at 01/26/2016  4:44 PM EST ----- Regarding: Sched Vascular Patient needs doppler and vasc consult-non palpable pulses, diabetic, ulceration between the 4th and 5th toes right. Orders are NOT in! Thanks!  01/27/2016-ORDERS FAXED TO [facility].

## 2016-02-09 ENCOUNTER — Ambulatory Visit: Payer: Medicare Other | Admitting: Podiatry

## 2016-02-11 ENCOUNTER — Ambulatory Visit (INDEPENDENT_AMBULATORY_CARE_PROVIDER_SITE_OTHER): Payer: Medicare Other | Admitting: Podiatry

## 2016-02-11 ENCOUNTER — Encounter: Payer: Self-pay | Admitting: Podiatry

## 2016-02-11 VITALS — BP 154/61 | HR 77 | Resp 16

## 2016-02-11 DIAGNOSIS — L89891 Pressure ulcer of other site, stage 1: Secondary | ICD-10-CM

## 2016-02-11 DIAGNOSIS — M1990 Unspecified osteoarthritis, unspecified site: Secondary | ICD-10-CM | POA: Insufficient documentation

## 2016-02-11 DIAGNOSIS — R0681 Apnea, not elsewhere classified: Secondary | ICD-10-CM | POA: Insufficient documentation

## 2016-02-11 DIAGNOSIS — L97514 Non-pressure chronic ulcer of other part of right foot with necrosis of bone: Secondary | ICD-10-CM

## 2016-02-11 DIAGNOSIS — F32A Depression, unspecified: Secondary | ICD-10-CM | POA: Insufficient documentation

## 2016-02-11 DIAGNOSIS — G8929 Other chronic pain: Secondary | ICD-10-CM | POA: Insufficient documentation

## 2016-02-11 DIAGNOSIS — K5190 Ulcerative colitis, unspecified, without complications: Secondary | ICD-10-CM | POA: Insufficient documentation

## 2016-02-11 DIAGNOSIS — F329 Major depressive disorder, single episode, unspecified: Secondary | ICD-10-CM | POA: Insufficient documentation

## 2016-02-11 MED ORDER — CLINDAMYCIN HCL 150 MG PO CAPS: 150.0000 mg | ORAL_CAPSULE | Freq: Three times a day (TID) | ORAL | Status: DC

## 2016-02-11 NOTE — Progress Notes (Signed)
She presents today for follow-up of her superficial ulceration between the fourth and fifth digits of the right foot and possible osteomyelitis fifth digit right foot. She states that she is just about completed her clindamycin at this point. She states that the toe seems to be doing better but is still painful if she presses on it or has anything that hits it.  Objective: Vital signs are stable tolerated 3 no erythema edema cellulitis drainage or odor. Evaluation of the fifth toe does demonstrate the ulceration appears to be getting smaller to the medial aspect of the PIPJ tucked way down in the sulcus of the fifth toes. No erythema no saline as drainage or odor.  Assessment: Ulceration possible osteomyelitis medial aspect fifth digit right foot.  Plan: Continue all conservative therapies debris in the wound today and placed her back on her clindamycin. She will continue her doxycycline as well.

## 2016-02-20 ENCOUNTER — Other Ambulatory Visit: Payer: Self-pay | Admitting: *Deleted

## 2016-02-20 DIAGNOSIS — Z789 Other specified health status: Secondary | ICD-10-CM

## 2016-03-01 ENCOUNTER — Encounter: Payer: Self-pay | Admitting: Podiatry

## 2016-03-01 ENCOUNTER — Ambulatory Visit (INDEPENDENT_AMBULATORY_CARE_PROVIDER_SITE_OTHER): Payer: Medicare Other | Admitting: Podiatry

## 2016-03-01 ENCOUNTER — Ambulatory Visit (INDEPENDENT_AMBULATORY_CARE_PROVIDER_SITE_OTHER): Payer: Medicare Other

## 2016-03-01 DIAGNOSIS — M86171 Other acute osteomyelitis, right ankle and foot: Secondary | ICD-10-CM

## 2016-03-01 DIAGNOSIS — L97514 Non-pressure chronic ulcer of other part of right foot with necrosis of bone: Secondary | ICD-10-CM

## 2016-03-01 DIAGNOSIS — L89891 Pressure ulcer of other site, stage 1: Secondary | ICD-10-CM | POA: Diagnosis not present

## 2016-03-01 NOTE — Progress Notes (Signed)
She presents today for follow-up of ulceration medial aspect fifth digit right foot. She states that he really doesn't hurt that much but he still has an open hole present. She states that she's been using zinc in the wound.  Objective: Vital signs are stable alert and oriented 3. Pulses are palpable. Neurologic sensorium is intact. Evaluation of the fifth toe does demonstrate a chronic ulceration that is deep to subcutaneous tissue and joint capsule. There is no purulence no malodor and currently no erythema but just some edema to the toe itself. Radiographs taken today do demonstrate probable early osteomyelitis fifth toe right foot. There is periostitis and breakdown of the bone which would really responsible for nonhealing wound chronic infection. As would her diabetes.  Assessment: Diabetes mellitus. 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  Plan: Continue conservative therapies of the wound. We discussed the possible need for surgical intervention today. I will allow her to follow up with me in 2 or 3 weeks for another discussion.

## 2016-03-09 DIAGNOSIS — M86179 Other acute osteomyelitis, unspecified ankle and foot: Secondary | ICD-10-CM | POA: Insufficient documentation

## 2016-03-09 DIAGNOSIS — F3342 Major depressive disorder, recurrent, in full remission: Secondary | ICD-10-CM | POA: Insufficient documentation

## 2016-03-10 ENCOUNTER — Telehealth: Payer: Self-pay | Admitting: *Deleted

## 2016-03-10 NOTE — Telephone Encounter (Signed)
Pt states she is ready to have the amputation and needs to know what she needs to do prior to the surgery.

## 2016-03-18 NOTE — Telephone Encounter (Signed)
I received a message from [place].  You are scheduled for an appointment with Dr. Al CorpusHyatt on the 17th.  He'll discuss it with you then about surgery.  If this is what he wants to do he'll give you the information that you will need.  "How far out is he for surgery?"  His next available is May 5.  [place] [place] [facility] [place]  Surgical center is located at [address] in the [place].

## 2016-03-22 ENCOUNTER — Ambulatory Visit (INDEPENDENT_AMBULATORY_CARE_PROVIDER_SITE_OTHER): Payer: Medicare Other | Admitting: Podiatry

## 2016-03-22 ENCOUNTER — Encounter: Payer: Self-pay | Admitting: Podiatry

## 2016-03-22 ENCOUNTER — Telehealth: Payer: Self-pay | Admitting: *Deleted

## 2016-03-22 VITALS — BP 131/68 | HR 76 | Resp 16

## 2016-03-22 DIAGNOSIS — M86171 Other acute osteomyelitis, right ankle and foot: Secondary | ICD-10-CM

## 2016-03-22 DIAGNOSIS — L8991 Pressure ulcer of unspecified site, stage 1: Secondary | ICD-10-CM

## 2016-03-22 DIAGNOSIS — L97514 Non-pressure chronic ulcer of other part of right foot with necrosis of bone: Secondary | ICD-10-CM

## 2016-03-22 NOTE — Progress Notes (Signed)
She presents today with her friend Bonita QuinLinda for follow-up and surgical consult regarding her right foot. She states it is still very sore like to consider amputation of the fifth digit right foot. She denies fever chills nausea vomiting muscle aches and pains. She was instructed by her primary doctor to go ahead with the surgery. She states that her blood sugar is running okay and her last hemoglobin A1c was in the mid sevens. She also continues to take Coumadin on a regular basis.  Objective: Vital signs are stable alert and oriented 3. Pulses are strongly palpable right foot. Ulceration medial aspect PIPJ fifth digit right foot is still painful. They're still malodor. Radiographs demonstrate osteolytic process consistent with osteo-myelitis.  Assessment osteomyelitis fifth digit right foot history diabetes.  Plan: We discussed the etiology pathology conservative versus surgical therapies. At this point she signed the consent form which stated disarticulation amputation fifth digit right foot at the level of the metatarsophalangeal joint. We discussed the possible postop complications which may include but are not limited to postop pain bleeding swelling infection recurrence need for further surgery overcorrection and under correction loss of digit loss of limb loss of life. She signed all 3 pages of the consent form. She will discontinue her Coumadin Tuesday prior to surgery on Friday. She was provided all of the information regarding the surgical facility and I will follow-up with her in the near future.

## 2016-03-22 NOTE — Telephone Encounter (Signed)
"  I just left Dr. Geryl [place]att[place].  He told me to call you and get scheduled for surgery as soon as possible for an Amputation."  He can do it on 04/02/2016.  "That date will be fine.  What time do I need to get there?"  Someone will call you a day or two prior to with the arrival time.  "Okay, I'll go ahead and register on line."

## 2016-03-23 ENCOUNTER — Telehealth: Payer: Self-pay | Admitting: *Deleted

## 2016-03-23 NOTE — Telephone Encounter (Signed)
"  I called you twice yesterday.  I apologize I guess it just floored me when I found out and I got excited.  Please give me a call back.  I'll be available until 10 am and then again in the afternoon."

## 2016-03-23 NOTE — Telephone Encounter (Signed)
"  You were going to hold me a spot for the 28th and that is great.  If you have any cancellations for this week give me a call.  I can't do it after tomorrow because I have to stop my Coumadin.  Let me know."

## 2016-03-23 NOTE — Telephone Encounter (Signed)
"  I'm sorry to keep calling.  I told my children about my surgery date and they became unglued because neither said they could be here with me.  So with that being said, can we move my surgery to the following Friday, May 3rd?"  Yes, that date will be fine.  "Thank you so much.  I'll go ahead and start registering on-line."

## 2016-03-30 DIAGNOSIS — L97509 Non-pressure chronic ulcer of other part of unspecified foot with unspecified severity: Secondary | ICD-10-CM

## 2016-03-30 DIAGNOSIS — E11621 Type 2 diabetes mellitus with foot ulcer: Secondary | ICD-10-CM | POA: Insufficient documentation

## 2016-04-05 ENCOUNTER — Telehealth: Payer: Self-pay | Admitting: *Deleted

## 2016-04-05 NOTE — Telephone Encounter (Addendum)
Post op courtesy call-Left message instruction pt to RICE, not to weight bear or dangle surgical foot more than 15 mins/hour, to leave original surgical dressing in place until changed by Dr. Al CorpusHyatt at her 04/05/2016 345pm appt, and to call with concerns. Pt states she did not have surgery on 04/02/2016, but would be having it this week, please call her again.  I left a message apologizing for the call, that her name and procedure were in the Surgery book for 04/02/2016, but I would have the surgery scheduler call her and confirm her surgery date.

## 2016-04-06 ENCOUNTER — Telehealth: Payer: Self-pay | Admitting: *Deleted

## 2016-04-06 NOTE — Telephone Encounter (Signed)
I'm returning your request to call. Your surgery is scheduled for 04/09/2016.  "Okay great, I understand I'll have to have someone with me and to drive me home.  How soon will I be able to drive?"  You will not be able to drive for a couple of weeks.  "That's not good.  I'd like to have a prescription for a few pain pills just in case I need them after surgery."  He'll give you a prescription the day of surgery.  "I can't get it called in to my pharmacy?  Alexis Vaughan said to ask about it because I could go ahead and get the prescription."  No ma'am narcotics cannot be called into the pharmacy.  You will have to have it in hand.  Dr. Al CorpusHyatt does not give prescriptions in advance.  "Alexis Vaughan said I have an appointment scheduled for my next appointment there.  I wasn't aware of an appointment."  Dr. Al CorpusHyatt normally gives that to you the day of surgery as well.  You're scheduled for 04/14/2016 at 3:45pm.  "Alexis Vaughan thank you so much for your help.  I appreciate everything.  I think you have answered all my questions."  You're welcome.

## 2016-04-06 NOTE — Telephone Encounter (Signed)
"  Alexis Vaughan you and I talked this morning but I have one more question I need to ask you.  Could you give me a call?"  I'm returning your call.  "I want to know if someone has checked my insurance to make sure everything is covered?"  I have, you have Medicare and that covers 80%.  You will be responsible for 20%.  "I have a supplement too, that should take care of the rest.  That's I all I had to ask."

## 2016-04-07 ENCOUNTER — Other Ambulatory Visit: Payer: Self-pay | Admitting: Podiatry

## 2016-04-07 MED ORDER — TRAMADOL HCL 50 MG PO TABS: ORAL_TABLET | ORAL | Status: DC

## 2016-04-07 MED ORDER — PROMETHAZINE HCL 25 MG PO TABS: 25.0000 mg | ORAL_TABLET | Freq: Three times a day (TID) | ORAL | Status: DC | PRN

## 2016-04-07 MED ORDER — CLINDAMYCIN HCL 150 MG PO CAPS: 150.0000 mg | ORAL_CAPSULE | Freq: Three times a day (TID) | ORAL | Status: DC

## 2016-04-09 ENCOUNTER — Encounter: Payer: Self-pay | Admitting: Podiatry

## 2016-04-09 DIAGNOSIS — M86679 Other chronic osteomyelitis, unspecified ankle and foot: Secondary | ICD-10-CM | POA: Diagnosis not present

## 2016-04-12 ENCOUNTER — Telehealth: Payer: Self-pay | Admitting: *Deleted

## 2016-04-12 NOTE — Progress Notes (Signed)
DOS 04/09/2016 Amputation toe, 5th to MTPJ right foot.

## 2016-04-12 NOTE — Telephone Encounter (Signed)
Post op courtesy call-Pt states she's doing well, has only taken 2 pain pills.  I told pt to continue RICE instructions, keep the boot on at all times, and leave the original surgical dressing in place until 1s POV on 04/14/2016.  I encouraged pt not to be on the surgical foot or allow to dangle over 15 minutes/hour, and to call with concerns..  Pt states understanding.

## 2016-04-14 ENCOUNTER — Encounter: Payer: Medicare Other | Admitting: Podiatry

## 2016-04-16 ENCOUNTER — Ambulatory Visit (INDEPENDENT_AMBULATORY_CARE_PROVIDER_SITE_OTHER): Payer: Medicare Other

## 2016-04-16 ENCOUNTER — Encounter: Payer: Self-pay | Admitting: Sports Medicine

## 2016-04-16 ENCOUNTER — Ambulatory Visit (INDEPENDENT_AMBULATORY_CARE_PROVIDER_SITE_OTHER): Payer: Medicare Other | Admitting: Sports Medicine

## 2016-04-16 ENCOUNTER — Encounter: Payer: Medicare Other | Admitting: Sports Medicine

## 2016-04-16 DIAGNOSIS — E1100 Type 2 diabetes mellitus with hyperosmolarity without nonketotic hyperglycemic-hyperosmolar coma (NKHHC): Secondary | ICD-10-CM

## 2016-04-16 DIAGNOSIS — M79671 Pain in right foot: Secondary | ICD-10-CM

## 2016-04-16 DIAGNOSIS — Z9889 Other specified postprocedural states: Secondary | ICD-10-CM

## 2016-04-16 DIAGNOSIS — M86171 Other acute osteomyelitis, right ankle and foot: Secondary | ICD-10-CM

## 2016-04-16 NOTE — Progress Notes (Signed)
Patient ID: Alexis Vaughan, female   DOB: 1951/02/28, 65 y.o.   MRN: [id number] Subjective: Alexis Vaughan is a 65 y.o. 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 Patient denies pain at surgical site, denies calf pain, denies headache, chest pain, shortness of breath, nausea, vomiting, fever, or chills. No other issues noted.   FBS Note recorded  Patient Active Problem List   Diagnosis Date Noted  . 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 Acute osteomyelitis of ankle or foot (HCC) 03/09/2016  . Recurrent major depressive disorder, in full remission (HCC) 03/09/2016  . Arthritis 02/11/2016  . Chronic pain 02/11/2016  . Clinical depression 02/11/2016  . Breathlessness on exertion 02/11/2016  . Ulcerative colitis (HCC) 02/11/2016  . H/O deep venous thrombosis 12/30/2015  . Encounter for surgical follow-up care 05/15/2015  . History of artificial joint 05/15/2015  . Type 2 diabetes mellitus (HCC) 05/12/2015  . History of operative procedure on hip 05/13/2014  . Fracture of humerus neck 04/18/2014  . Phlebitis of deep vein of lower extremity (HCC) 04/04/2014  . Hypercholesterolemia 04/11/2013  . BP (high blood pressure) 04/11/2013  . 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 Acquired spondylolisthesis 10/25/2012  . Degeneration of intervertebral disc of lumbosacral region 07/11/2012  . Lumbar canal stenosis 06/20/2012  . Calf muscle weakness 05/31/2012  . Difficulty in walking 05/31/2012  . Thoracic and lumbosacral neuritis 05/31/2012  . 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 amLODipine (NORVASC) 5 MG tablet     . Ascorbic Acid (VITAMIN C) 1000 MG tablet Take by mouth.    . B Complex Vitamins (VITAMIN-B COMPLEX) TABS Take by mouth.     Marland Kitchen buPROPion (WELLBUTRIN SR) 100 MG 12 hr tablet     . Calcium Carbonate-Vitamin D (CALCIUM-VITAMIN D) 500-200 MG-UNIT tablet Take by mouth.    . Cholecalciferol (VITAMIN D3) 1000 units CAPS Take by mouth.    . clindamycin (CLEOCIN) 150 MG capsule Take 1 capsule (150 mg total) by mouth 3 (three) times daily. 30 capsule 0  . clobetasol cream (TEMOVATE) 0.05 %     . cloNIDine (CATAPRES) 0.1 MG tablet     . cyanocobalamin (,VITAMIN B-12,) 1000 MCG/ML injection Inject into the muscle.    . fentaNYL (DURAGESIC - DOSED MCG/HR) 50 MCG/HR Place onto the skin.    . fentaNYL (DURAGESIC - DOSED MCG/HR) 50 MCG/HR     . fluconazole (DIFLUCAN) 150 MG tablet Take 1 tablet (150 mg total) by mouth once. 1 tablet 2  . gabapentin (NEURONTIN) 300 MG capsule TAKE 2 CAPSULES BY MOUTH EVERY MORNING AND 3 CAPSULES EVERY EVENING    . gabapentin (NEURONTIN) 300 MG capsule     . Insulin Glargine (LANTUS SOLOSTAR) 100 UNIT/ML Solostar Pen Inject 65 units subcutaneously nightly    . insulin lispro (HUMALOG) 100 UNIT/ML KiwkPen Inject 25 units before breakfast and lunch, and 40 units before dinner.    . levothyroxine (SYNTHROID, LEVOTHROID) 50 MCG tablet Take by mouth.    . lisdexamfetamine (VYVANSE) 20 MG capsule Take by mouth.    Marland Kitchen lisinopril (PRINIVIL,ZESTRIL) 2.5 MG tablet     . metaxalone (SKELAXIN) 800 MG tablet Take by mouth.    . metoprolol succinate (TOPROL-XL) 50 MG 24 hr tablet TAKE 1 TABLET(50 MG)  BY MOUTH EVERY DAY    . Multiple Vitamin (MULTI-VITAMINS) TABS Take by mouth.    . mupirocin ointment (BACTROBAN) 2 %     . nystatin (MYCOSTATIN/NYSTOP) 100000 UNIT/GM POWD Apply topically.    Marland Kitchen. omeprazole (PRILOSEC) 40 MG capsule Take by mouth.    . ondansetron (ZOFRAN) 4 MG tablet Take by mouth.    . pravastatin (PRAVACHOL) 80 MG tablet Take by mouth.    . promethazine (PHENERGAN) 12.5 MG tablet Take by mouth.    . promethazine (PHENERGAN) 25 MG tablet Take 1 tablet (25 mg total) by mouth every 8 (eight) hours  as needed for nausea or vomiting. 20 tablet 0  . SYRINGE-NEEDLE, DISP, 3 ML (B-D 3CC LUER-LOK SYR 25GX1") 25G X 1" 3 ML MISC use 1 as directed every month    . traMADol (ULTRAM) 50 MG tablet Take one to two tablets by mouth every six to eight hours as needed for pain. 40 tablet 0  . traZODone (DESYREL) 100 MG tablet Take by mouth.    . venlafaxine XR (EFFEXOR-XR) 150 MG 24 hr capsule Take by mouth.    Marland Kitchen. VYVANSE 60 MG capsule     . warfarin (COUMADIN) 1 MG tablet Take by mouth.     No current facility-administered medications on file prior to visit.    Allergies  Allergen Reactions  . Hydrocodone Other (See Comments)    Renal Failure  . Morphine Other (See Comments)    Renal Failure  . Oxycodone Other (See Comments)    Renal Failure    Objective: There were no vitals filed for this visit.  General: No acute distress, AAOx3  Right foot: Sutures intact with no gapping or dehiscence at surgical 5th toe amp site, mild swelling to right foot, no erythema, no warmth, no drainage, no signs of infection noted, Capillary fill time <3 seconds in all digits 1-4 on right foot, gross sensation present via light touch to right foot. No pain or crepitation with range of motion right foot.  No pain with calf compression.   Post Op Xray, Right foot: Consistent with amputation fifth toe at the level of the metatarsophalangeal joint. Ankle hardware intact. Soft tissue swelling within normal limits for post op status.   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 Al CorpusHyatt    Acute osteomyelitis of ankle and foot, right (HCC)          -Patient seen and evaluated -Applied dry sterile dressing to surgical  site right foot secured with ACE wrap and stockinet  -Advised patient to make sure to keep dressings clean, dry, and intact to right surgical site -Advised patient to continue with post-op shoe on right foot   -Advised patient to limit activity to necessity  -Advised patient to ice and elevate as necessary and continue with antibiotics as prescribed and PRN meds as needed -Pathology report pending to be faxed over to office per request -Will plan for suture removal at next office visit with Dr. Al CorpusHyatt. In the meantime, patient to call office if any issues or problems arise.  Landis Martins, DPM

## 2016-04-21 ENCOUNTER — Ambulatory Visit (INDEPENDENT_AMBULATORY_CARE_PROVIDER_SITE_OTHER): Payer: Medicare Other | Admitting: Podiatry

## 2016-04-21 ENCOUNTER — Telehealth: Payer: Self-pay | Admitting: *Deleted

## 2016-04-21 ENCOUNTER — Encounter: Payer: Self-pay | Admitting: Podiatry

## 2016-04-21 VITALS — BP 131/68 | HR 76 | Resp 16

## 2016-04-21 DIAGNOSIS — Z9889 Other specified postprocedural states: Secondary | ICD-10-CM

## 2016-04-21 DIAGNOSIS — M86171 Other acute osteomyelitis, right ankle and foot: Secondary | ICD-10-CM

## 2016-04-21 NOTE — Telephone Encounter (Signed)
Patient called asking about the results of the cultures that Dr. Al CorpusHyatt sent off during surgery. Please call with results.

## 2016-04-21 NOTE — Progress Notes (Signed)
She presents today nearly 2 weeks status post an fixation fifth digit right foot. She denies fever chills nausea vomiting muscle aches and pains and states that she's had no pain.  Objective: Dry sterile dressing intact was removed today demonstrates no erythema cellulitis drainage or odor margins appear to be well coapted us in no signs of infection. Sutures were removed today margins remain well coapted. Steri-Strips are placed.  Assessment: Well-healing surgical foot right.  Plan: Steri-Strips placed today and we'll allow her to start getting this wet tomorrow and follow-up with her in 2 weeks. I'm requesting that she continue the use of her Darco shoe for at least 1 more week. Her pathology report was positive for acute osteomyelitis.

## 2016-04-22 NOTE — Telephone Encounter (Signed)
Yes all of the infection is out but needs to stay on abx because of the risk of post op infection.  So please complete.

## 2016-04-22 NOTE — Telephone Encounter (Addendum)
Pt states she would like to know if Dr. Al CorpusHyatt got all of the osteomyelitis during the surgery and does she need to be on an antibiotic.  Informed pt of Dr. Geryl RankinsHyatt's statement and orders to complete the antibiotic.  Pt states understanding and feels better.

## 2016-04-30 ENCOUNTER — Encounter: Payer: Self-pay | Admitting: *Deleted

## 2016-05-05 ENCOUNTER — Ambulatory Visit (INDEPENDENT_AMBULATORY_CARE_PROVIDER_SITE_OTHER): Payer: Medicare Other | Admitting: Podiatry

## 2016-05-05 ENCOUNTER — Encounter: Payer: Self-pay | Admitting: Podiatry

## 2016-05-05 VITALS — BP 121/67 | HR 74 | Resp 18

## 2016-05-05 DIAGNOSIS — M86171 Other acute osteomyelitis, right ankle and foot: Secondary | ICD-10-CM

## 2016-05-05 DIAGNOSIS — Z9889 Other specified postprocedural states: Secondary | ICD-10-CM

## 2016-05-05 NOTE — Progress Notes (Signed)
She presents today for follow-up of a rotation fifth digit right foot. She states that it seems to be doing just fine. She would like to consider elevation were raised to her shoe gear. Because of the shortness of the right lower extremity after multiple hip surgeries she is unable to walk a normal gait.  Objective: Vital signs are stable she is alert and oriented 3. Pulses are palpable. She has approximately three-quarter inch leg length discrepancy short on the right side. Surgical site is gone on to heal uneventfully. There is still some swelling present no open lesions no open wounds.  Assessment: Well-healing surgical foot right. Short right leg length right.  Plan: I placed a quarter inch felt pad beneath her right heel and she states that this feels much better to her. I will allow her to get back to regular shoe gear. I will follow up with her for her next postop visit and consider orthotics or sending her to a lab to have external repairs made her shoes.

## 2016-05-18 ENCOUNTER — Other Ambulatory Visit: Payer: Self-pay | Admitting: Internal Medicine

## 2016-05-18 ENCOUNTER — Encounter: Payer: Self-pay | Admitting: Podiatry

## 2016-05-18 DIAGNOSIS — Z1231 Encounter for screening mammogram for malignant neoplasm of breast: Secondary | ICD-10-CM

## 2016-06-02 ENCOUNTER — Encounter: Payer: Self-pay | Admitting: Podiatry

## 2016-06-02 ENCOUNTER — Ambulatory Visit (INDEPENDENT_AMBULATORY_CARE_PROVIDER_SITE_OTHER): Payer: Medicare Other | Admitting: Podiatry

## 2016-06-02 DIAGNOSIS — M86171 Other acute osteomyelitis, right ankle and foot: Secondary | ICD-10-CM

## 2016-06-02 DIAGNOSIS — Z9889 Other specified postprocedural states: Secondary | ICD-10-CM

## 2016-06-02 NOTE — Progress Notes (Signed)
She presents today for follow-up of her agitation fifth digit of the right foot she's doing very well with this and has healed 100% she's very happy with the outcome of that. She states that she will she had done earlier. She is also concerned about her unstable gait and her fall risk.  Objective: Vital signs are stable she is alert and oriented 3 right foot demonstrate rotation fifth digit of the right foot which has gone to heal 100% pulses are palpable muscle strength is normal. No open lesions or wounds are noted.  Assessment: Well-healing surgical foot right. Patient is a fall risk walking with a cane and is very unstable.  Plan: At this point I have requested that she visit physical therapy for strength balance and gait training. I have also suggested that we could write her prescription for the trainer/PT trainer at the [facility] [facility]. Follow up with her as needed.

## 2016-06-03 ENCOUNTER — Ambulatory Visit
Admission: RE | Admit: 2016-06-03 | Discharge: 2016-06-03 | Disposition: A | Discharge status: Home / Self Care | Payer: Medicare Other | Source: Ambulatory Visit | Attending: Internal Medicine | Admitting: Internal Medicine

## 2016-06-03 DIAGNOSIS — Z1231 Encounter for screening mammogram for malignant neoplasm of breast: Secondary | ICD-10-CM | POA: Insufficient documentation

## 2016-06-22 ENCOUNTER — Ambulatory Visit: Payer: Medicare Other | Attending: Podiatry | Admitting: Physical Therapy

## 2016-06-22 DIAGNOSIS — R2681 Unsteadiness on feet: Secondary | ICD-10-CM

## 2016-06-22 DIAGNOSIS — M6281 Muscle weakness (generalized): Secondary | ICD-10-CM | POA: Insufficient documentation

## 2016-06-22 DIAGNOSIS — R262 Difficulty in walking, not elsewhere classified: Secondary | ICD-10-CM | POA: Diagnosis present

## 2016-06-23 ENCOUNTER — Encounter: Payer: Self-pay | Admitting: Physical Therapy

## 2016-06-23 NOTE — Therapy (Deleted)
[facility] [address] [place], [place], [address] Phone: [phone]   Fax:  [phone]  Physical Therapy Evaluation  Patient Details  Name: Alexis Vaughan MRN: [id number] Date of Birth: [birth year] Referring Provider: Dr. Al CorpusHyatt  Encounter Date: 06/22/2016    Past Medical History:  Diagnosis Date  . Arthritis   . Bruises easily   . Colitis   . Depression   . Diabetes mellitus without complication (HCC)   . H/O blood clots   . Hypertension   . Poor circulation   . Swelling     Past Surgical History:  Procedure Laterality Date  . BREAST BIOPSY Right    stereo/clip- neg    There were no vitals filed for this visit.    Pt. reports no pain at this time and states 5th toe amputation has gone well.  No issues.  Pt. reports chronic h/o falls and is currently walking with SPC.      There.ex.:  See HEP/ handouts.      Pt. is a pleasant 65 y/o female with chronic h/o surgeries/ decreased mobility since 08/2011 after initial R THA.  Pt. 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  Pt. reports no pain in toe/foot since surgery but limited ambulation and gait sequencing with use of SPC.  Pt. currently participating with [employer] ex. program 2x/week at [facility] (seated ex./ Yoga) and reamins independent at home (fearful of falling).  LEFS: 25 out of 80.  Pt. presents with generalized B LE muscle weakness grossly 4/5 MMT (no c/o pain).  Pt. ambulates with slow, antalgic gait pattern with moderate lateral lean noted and benefits from use of SPC to correct posture/ gait.  Pt. will benefit from skilled PT services to increase B LE muscle strength/ safety with gait to improve overall mobility.         PT Long Term Goals - 06/23/16 1250      PT LONG TERM GOAL #1   Title Pt. I with HEP to increase B LE muscle strength grossly 1/2 muscle grade to improve pain-free mobility/ prevent falls.     Baseline B LE muscle strength grossly  4/5 MMT.  Generalized muscle fatigue/ overall deconditioning.     Time 4   Period Weeks   Status New     PT LONG TERM GOAL #2   Title Pt. will increase LEFS to >40 out of 80 to improve independence with functional mobility.     Baseline LEFS: 25 out of 80 06/22/16   Time 4   Period Weeks   Status New     PT LONG TERM GOAL #3   Title Pt. will increase Berg balance test to >50/56 to decrease fall risk/ improve independence with gait.     Baseline 44/56 on 7/18   Time 4   Period Weeks   Status New     PT LONG TERM GOAL #4   Title Pt. will be independent with gym based/ ex. program to maintain functional strength/ overall mobility.     Baseline recently started gym ex.   Time 4   Period Weeks   Status New           Patient will benefit from skilled therapeutic intervention in order to improve the following deficits and impairments:  Abnormal gait, Decreased strength, Improper body mechanics, Postural dysfunction, Difficulty walking, Decreased knowledge of use of DME, Decreased mobility, Decreased activity tolerance, Impaired flexibility, Decreased range of motion, Decreased  balance  Visit Diagnosis: Difficulty in walking, not elsewhere classified  Muscle weakness (generalized)  Unsteady gait     Problem List Patient Active Problem List   Diagnosis Date Noted  . 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 Acute osteomyelitis of ankle or foot (HCC) 03/09/2016  . Recurrent major depressive disorder, in full remission (HCC) 03/09/2016  . Arthritis 02/11/2016  . Chronic pain 02/11/2016  . Clinical depression 02/11/2016  . Breathlessness on exertion 02/11/2016  . Ulcerative colitis (HCC) 02/11/2016  . H/O deep venous thrombosis 12/30/2015  . Encounter for surgical follow-up care 05/15/2015  . History of artificial joint 05/15/2015  . Type 2 diabetes mellitus (HCC) 05/12/2015  . History of operative procedure on hip 05/13/2014  . Fracture of  humerus neck 04/18/2014  . Phlebitis of deep vein of lower extremity (HCC) 04/04/2014  . Hypercholesterolemia 04/11/2013  . BP (high blood pressure) 04/11/2013  . 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 Acquired spondylolisthesis 10/25/2012  . Degeneration of intervertebral disc of lumbosacral region 07/11/2012  . Lumbar canal stenosis 06/20/2012  . Calf muscle weakness 05/31/2012  . Difficulty in walking 05/31/2012  . Thoracic and lumbosacral neuritis 05/31/2012  . 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 [place], [place], [address] Phone: [phone]   Fax:  [phone]  Name: Alexis Vaughan MRN: [id number] Date of Birth: [birth year]

## 2016-06-24 ENCOUNTER — Ambulatory Visit: Payer: Medicare Other | Admitting: Physical Therapy

## 2016-06-24 DIAGNOSIS — R262 Difficulty in walking, not elsewhere classified: Secondary | ICD-10-CM

## 2016-06-24 DIAGNOSIS — M6281 Muscle weakness (generalized): Secondary | ICD-10-CM

## 2016-06-24 DIAGNOSIS — R2681 Unsteadiness on feet: Secondary | ICD-10-CM

## 2016-06-24 NOTE — Therapy (Addendum)
[place] [facility] [address] [place], [place], [address] Phone: [phone]   Fax:  [phone]  Physical Therapy Treatment  Patient Details  Name: Alexis MannsLinda C Vaughan MRN: [id number] Date of Birth: 1951-01-27 Referring Provider: Dr. Al CorpusHyatt  Encounter Date: 06/24/2016    Past Medical History:  Diagnosis Date  . Arthritis   . Bruises easily   . Colitis   . Depression   . Diabetes mellitus without complication (HCC)   . H/O blood clots   . Hypertension   . Poor circulation   . Swelling     Past Surgical History:  Procedure Laterality Date  . BREAST BIOPSY Right    stereo/clip- neg    There were no vitals filed for this visit.    Pt. reports no new complaints and explained her gym based ex. program/ Nustep use for 30 min.     OBJECTIVE:  There.ex.:  Nustep L5 10 min. B UE/LE (no charge/ warm-up).  Reviewed HEP/ standing hip ex.  Neuro. Mm.: obstacle course in hallway (cone taps/ step ups/ Airex/ maneuvering).  Gait training: ambulate with use of SPC in clinic working on 2-point gait/ upright posture and consistent step pattern/ heel strike.      Pt response for medical necessity: Pt. Benefits from generalized B LE muscle strengthening to improve independence/ safety with gait on level surfaces.  LOB in clinic with ability to self-correct with use of UE.      Moderate lateral lean noted when ambulating in clinic towards mirror/ feedback.  No c/o pain but benefits from use of gait belt with all standing/balance tasks.  Several small LOB but able to self-correct with use of UE/ //-bars.           PT Long Term Goals - 06/23/16 1250      PT LONG TERM GOAL #1   Title Pt. I with HEP to increase B LE muscle strength grossly 1/2 muscle grade to improve pain-free mobility/ prevent falls.     Baseline B LE muscle strength grossly 4/5 MMT.  Generalized muscle fatigue/ overall deconditioning.     Time 4   Period Weeks   Status New     PT LONG TERM GOAL #2   Title Pt. will increase LEFS to >40 out of 80 to improve independence with functional mobility.     Baseline LEFS: 25 out of 80 06/22/16   Time 4   Period Weeks   Status New     PT LONG TERM GOAL #3   Title Pt. will increase Berg balance test to >50/56 to decrease fall risk/ improve independence with gait.     Baseline 44/56 on 7/18   Time 4   Period Weeks   Status New     PT LONG TERM GOAL #4   Title Pt. will be independent with gym based/ ex. program to maintain functional strength/ overall mobility.     Baseline recently started gym ex.   Time 4   Period Weeks   Status New             Patient will benefit from skilled therapeutic intervention in order to improve the following deficits and impairments:  Abnormal gait, Decreased strength, Improper body mechanics, Postural dysfunction, Difficulty walking, Decreased knowledge of use of DME, Decreased mobility, Decreased activity tolerance, Impaired flexibility, Decreased range of motion, Decreased balance  Visit Diagnosis: Difficulty in walking, not elsewhere classified  Muscle weakness (generalized)  Unsteady gait  Problem List Patient Active Problem List   Diagnosis Date Noted  . 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 Acute osteomyelitis of ankle or foot (HCC) 03/09/2016  . Recurrent major depressive disorder, in full remission (HCC) 03/09/2016  . Arthritis 02/11/2016  . Chronic pain 02/11/2016  . Clinical depression 02/11/2016  . Breathlessness on exertion 02/11/2016  . Ulcerative colitis (HCC) 02/11/2016  . H/O deep venous thrombosis 12/30/2015  . Encounter for surgical follow-up care 05/15/2015  . History of artificial joint 05/15/2015  . Type 2 diabetes mellitus (HCC) 05/12/2015  . History of operative procedure on hip 05/13/2014  . Fracture of humerus neck 04/18/2014  . Phlebitis of deep vein of lower extremity (HCC) 04/04/2014  . Hypercholesterolemia  04/11/2013  . BP (high blood pressure) 04/11/2013  . 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 Acquired spondylolisthesis 10/25/2012  . Degeneration of intervertebral disc of lumbosacral region 07/11/2012  . Lumbar canal stenosis 06/20/2012  . Calf muscle weakness 05/31/2012  . Difficulty in walking 05/31/2012  . Thoracic and lumbosacral neuritis 05/31/2012  . 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 [place], [place], [address] Phone: [phone]   Fax:  [phone]  Name: Alexis Vaughan MRN: [id number] Date of Birth: 05/07/1951

## 2016-06-29 ENCOUNTER — Encounter: Payer: Self-pay | Admitting: Physical Therapy

## 2016-06-29 ENCOUNTER — Ambulatory Visit: Payer: Medicare Other | Admitting: Physical Therapy

## 2016-06-29 DIAGNOSIS — R2681 Unsteadiness on feet: Secondary | ICD-10-CM

## 2016-06-29 DIAGNOSIS — R262 Difficulty in walking, not elsewhere classified: Secondary | ICD-10-CM

## 2016-06-29 DIAGNOSIS — M6281 Muscle weakness (generalized): Secondary | ICD-10-CM

## 2016-06-29 NOTE — Therapy (Addendum)
[place] [facility] [address] [place], [place], [address] Phone: [phone]   Fax:  [phone]  Physical Therapy Treatment  Patient Details  Name: Alexis Vaughan MRN: [id number] Date of Birth: 06/07/1951 Referring Provider: Dr. Al Corpus  Encounter Date: 06/29/2016    Past Medical History:  Diagnosis Date  . Arthritis   . Bruises easily   . Colitis   . Depression   . Diabetes mellitus without complication (HCC)   . H/O blood clots   . Hypertension   . Poor circulation   . Swelling     Past Surgical History:  Procedure Laterality Date  . BREAST BIOPSY Right    stereo/clip- neg    There were no vitals filed for this visit.    Objective:  There ex: (warm up) Sci Fit 3 minutes backward/7 minutes forward Level 6. Supine: bridge 2x10 pt requires frequent cueing to focus on slow, controlled movement as she has a tendency to rush through exercises due to early onset of fatigue; responsive to cueing to slow down with increase in difficulty of exercise. SLR 2x10 pt requires repeated cueing to slow down. Hip flexor stretch x45sec with pt c/o of too intense of a stretch prior to resistance; pt able to tolerate modified stretch. Sit<>stand 2x10 with no UE support.   Neuromuscular Re-ed: Pt unable to perform single leg stance with UE support >10sec. Tandem stance: pt requires UE to perform balance corrections. Semi-tandem: 4x30sec; with pt noticing improvement in balance performance after several repetitions.  Gait: Pt demonstrates improved gait pattern when ambulating with rollator 2x81ft with improved stability and decreased compensation. She is able to achieve similar results walking with cane on L side, with decreased L lateral trunk lean. Pt encouraged to walk with cane on LLE to improve gait mechanics and promote functional strength gains.   Pt response for medical necessity: Pt demonstrates weakness of proximal hip musculature with  balance deficits and tendency to perform exercise with extrinsic musculature, requiring frequent cueing to maintain controlled and deliberate movements. She will benefit from skilled PT services to progress strengthening and balance program to promote stability and safety with ambulation.     Patient will benefit from skilled therapeutic intervention in order to improve the following deficits and impairments:  Abnormal gait, Decreased strength, Improper body mechanics, Postural dysfunction, Difficulty walking, Decreased knowledge of use of DME, Decreased mobility, Decreased activity tolerance, Impaired flexibility, Decreased range of motion, Decreased balance  Visit Diagnosis: Difficulty in walking, not elsewhere classified  Muscle weakness (generalized)  Unsteady gait     Problem List Patient Active Problem List   Diagnosis Date Noted  . 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 Acute osteomyelitis of ankle or foot (HCC) 03/09/2016  . Recurrent major depressive disorder, in full remission (HCC) 03/09/2016  . Arthritis 02/11/2016  . Chronic pain 02/11/2016  . Clinical depression 02/11/2016  . Breathlessness on exertion 02/11/2016  . Ulcerative colitis (HCC) 02/11/2016  . H/O deep venous thrombosis 12/30/2015  . Encounter for surgical follow-up care 05/15/2015  . History of artificial joint 05/15/2015  . Type 2 diabetes mellitus (HCC) 05/12/2015  . History of operative procedure on hip 05/13/2014  . Fracture of humerus neck 04/18/2014  . Phlebitis of deep vein of lower extremity (HCC) 04/04/2014  . Hypercholesterolemia 04/11/2013  . BP (high blood pressure) 04/11/2013  . 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  Acquired spondylolisthesis 10/25/2012  . Degeneration of intervertebral disc of lumbosacral region 07/11/2012  . Lumbar canal stenosis 06/20/2012  . Calf muscle weakness  05/31/2012  . Difficulty in walking 05/31/2012  . Thoracic and lumbosacral neuritis 05/31/2012  . 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 [address] [place], [place], [address] Phone: [phone]   Fax:  [phone]  Name: Alexis Vaughan MRN: [id number] Date of Birth: [birth year]

## 2016-07-01 ENCOUNTER — Ambulatory Visit: Payer: Medicare Other | Admitting: Physical Therapy

## 2016-07-01 DIAGNOSIS — R2681 Unsteadiness on feet: Secondary | ICD-10-CM

## 2016-07-01 DIAGNOSIS — R262 Difficulty in walking, not elsewhere classified: Secondary | ICD-10-CM

## 2016-07-01 DIAGNOSIS — M6281 Muscle weakness (generalized): Secondary | ICD-10-CM

## 2016-07-01 NOTE — Therapy (Addendum)
[place] [facility] [address] [place], [place], [address] Phone: [phone]   Fax:  [phone]  Physical Therapy Treatment  Patient Details  Name: Alexis Vaughan MRN: [id number] Date of Birth: [birth year] Referring Provider: Dr. Al Corpus  Encounter Date: 07/01/2016    Past Medical History:  Diagnosis Date  . Arthritis   . Bruises easily   . Colitis   . Depression   . Diabetes mellitus without complication (HCC)   . H/O blood clots   . Hypertension   . Poor circulation   . Swelling     Past Surgical History:  Procedure Laterality Date  . BREAST BIOPSY Right    stereo/clip- neg    There were no vitals filed for this visit.    Pt. reports she is  a little tired today.  No c/o pain.  No recent falls.     Objective:  There ex: (warm up) Nustep L5 10 min. B LE only (no charge).  Standing marching with mirror feedback.  Sit to stands from green chair 10x with instruct in posture/ correct technique.  Supine: bridge 2x10,  SLR 2x10 pt requires repeated cueing to slow down. Hip flexor stretch x45sec with pt c/o of too intense of a stretch prior to resistance; pt able to tolerate modified stretch. Sit<>stand 2x10 with no UE support.   Neuromuscular Re-ed: Obstacle course in hallway: step ups (HHA with 6" step and no SPC), Airex marching, recip. UE/LE touches in //-bars with cuing for upright posture and incr. Hip flexion.  Lateral walking/ cone taps.    Pt response for medical necessity: Pt demonstrates weakness of proximal hip musculature with balance deficits and tendency to perform exercise with extrinsic musculature, requiring frequent cueing to maintain controlled and deliberate movements. She will benefit from skilled PT services to progress strengthening and balance program to promote stability and safety with ambulation.   Extra time/ difficulty with recip. touches during balance tasks.  B hip (L>R) hip ER noted during swing  through phase of gait.  HHA with step ups in hallway for safety.  Pt. continues to benefit from use of SPC on L.      Patient will benefit from skilled therapeutic intervention in order to improve the following deficits and impairments:  Abnormal gait, Decreased strength, Improper body mechanics, Postural dysfunction, Difficulty walking, Decreased knowledge of use of DME, Decreased mobility, Decreased activity tolerance, Impaired flexibility, Decreased range of motion, Decreased balance  Visit Diagnosis: Difficulty in walking, not elsewhere classified  Muscle weakness (generalized)  Unsteady gait     Problem List Patient Active Problem List   Diagnosis Date Noted  . 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 Acute osteomyelitis of ankle or foot (HCC) 03/09/2016  . Recurrent major depressive disorder, in full remission (HCC) 03/09/2016  . Arthritis 02/11/2016  . Chronic pain 02/11/2016  . Clinical depression 02/11/2016  . Breathlessness on exertion 02/11/2016  . Ulcerative colitis (HCC) 02/11/2016  . H/O deep venous thrombosis 12/30/2015  . Encounter for surgical follow-up care 05/15/2015  . History of artificial joint 05/15/2015  . Type 2 diabetes mellitus (HCC) 05/12/2015  . History of operative procedure on hip 05/13/2014  . Fracture of humerus neck 04/18/2014  . Phlebitis of deep vein of lower extremity (HCC) 04/04/2014  . Hypercholesterolemia 04/11/2013  . BP (high blood pressure) 04/11/2013  . 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  Acquired spondylolisthesis 10/25/2012  . Degeneration of intervertebral disc of lumbosacral region 07/11/2012  . Lumbar canal stenosis 06/20/2012  . Calf muscle weakness 05/31/2012  . Difficulty in walking 05/31/2012  . Thoracic and lumbosacral neuritis 05/31/2012  . 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

## 2016-07-06 ENCOUNTER — Ambulatory Visit: Payer: Medicare Other | Attending: Podiatry | Admitting: Physical Therapy

## 2016-07-06 ENCOUNTER — Encounter: Payer: Self-pay | Admitting: Physical Therapy

## 2016-07-06 DIAGNOSIS — M6281 Muscle weakness (generalized): Secondary | ICD-10-CM | POA: Insufficient documentation

## 2016-07-06 DIAGNOSIS — R2681 Unsteadiness on feet: Secondary | ICD-10-CM | POA: Insufficient documentation

## 2016-07-06 DIAGNOSIS — R262 Difficulty in walking, not elsewhere classified: Secondary | ICD-10-CM | POA: Insufficient documentation

## 2016-07-06 NOTE — Therapy (Signed)
[place] [facility] [address] [place], [place], [address] Phone: [phone]   Fax:  [phone]  Physical Therapy Treatment  Patient Details  Name: Alexis Vaughan MRN: [id number] Date of Birth: 1951/03/16 Referring Provider: Dr. Al Corpus  Encounter Date: 07/06/2016      PT End of Session - 07/06/16 1346    Visit Number 5   Number of Visits 8   Date for PT Re-Evaluation 07/20/16   Authorization - Visit Number 5   Authorization - Number of Visits 10   PT Start Time 1308   PT Stop Time 1402   PT Time Calculation (min) 54 min   Activity Tolerance Patient tolerated treatment well;Patient limited by fatigue   Behavior During Therapy [facility] for tasks assessed/performed      Past Medical History:  Diagnosis Date  . Arthritis   . Bruises easily   . Colitis   . Depression   . Diabetes mellitus without complication (HCC)   . H/O blood clots   . Hypertension   . Poor circulation   . Swelling     Past Surgical History:  Procedure Laterality Date  . BREAST BIOPSY Right    stereo/clip- neg    There were no vitals filed for this visit.      Subjective Assessment - 07/06/16 1346    Subjective Pt. states she hasn't been back to gym since last Friday.  Pt. reports not feeling great Sunday/Monday.  Pt. entered PT with use of SPC on L side and focusing on preventing lateral lean.     Limitations Lifting;Standing;Walking;House hold activities   Patient Stated Goals ambulate with no assistive device safely/ participate with ex. program and no limitations.     Currently in Pain? No/denies        Objective:  There ex: (warm up): Nustep L6 10 min. B UE/LE (no charge/warm-up).  Wall squats with towel at door 15x.  Sit to stands from bench outside 10x with no UE assist. Step ups/down on 6" step with light UE assist in //-bars 10x2 (cuing for posture).   Neuromuscular Re-ed: Lateral steps in //-bars with no UE assist and cuing for upright  posture (focusing on keep LE in midline/ avoiding hip ER).  Cone taps with light UE assist in //-bars.  Airex taps with no UE assist.  Recip. UE/LE touches in //-bars 10x2.    Gait: Amb. In clinic/ outside with use of SPC on L working on 2-point gait pattern and consistent step pattern/ heel strike.  Working on walking on grassy/ uneven terrain outside and ascending curbs with use of SPC.  Maneuvering around small SUV working on putting purse/SPC in back seat and transferring into front seat.     Pt response for medical necessity: Pt. benefits from skilled PT services to progress strengthening and balance program to promote stability and safety with ambulation.  Pt. Has several small LOB without use of assistive device (posteriorly).            PT Education - 07/06/16 1403    Education Details Gait training (outdoor walking up/down curbs).   Person(s) Educated Patient   Methods Explanation;Demonstration;Handout   Comprehension Verbalized understanding;Returned demonstration;Need further instruction             PT Long Term Goals - 06/23/16 1250      PT LONG TERM GOAL #1   Title Pt. I with HEP to increase B LE muscle strength grossly 1/2 muscle grade to improve pain-free  mobility/ prevent falls.     Baseline B LE muscle strength grossly 4/5 MMT.  Generalized muscle fatigue/ overall deconditioning.     Time 4   Period Weeks   Status New     PT LONG TERM GOAL #2   Title Pt. will increase LEFS to >40 out of 80 to improve independence with functional mobility.     Baseline LEFS: 25 out of 80 06/22/16   Time 4   Period Weeks   Status New     PT LONG TERM GOAL #3   Title Pt. will increase Berg balance test to >50/56 to decrease fall risk/ improve independence with gait.     Baseline 44/56 on 7/18   Time 4   Period Weeks   Status New     PT LONG TERM GOAL #4   Title Pt. will be independent with gym based/ ex. program to maintain functional strength/ overall mobility.      Baseline recently started gym ex.   Time 4   Period Weeks   Status New               Plan - 07/06/16 1345    Clinical Impression Statement Pt. had 2 episodes of posterior lean/loss of balance on heels while outside standing and talking requiring PT assist to correct/ prevent fall.  Pt. able to step up/down on curb and ambulate safely on grassy terrain with use of SPC and SBA for safety.  Pt. requires extra time and several short seated rest breaks to complete ther.ex. program.       Rehab Potential Fair   PT Frequency 2x / week   PT Duration 4 weeks   PT Treatment/Interventions ADLs/Self Care Home Management;Cryotherapy;Patient/family education;Cognitive remediation;Neuromuscular re-education;Balance training;Therapeutic exercise;Therapeutic activities;Functional mobility training;Stair training;Gait training;Manual techniques;Passive range of motion   PT Next Visit Plan Gait training/ B LE muscle strengthening and balance tasks.     PT Home Exercise Plan See HEP   Consulted and Agree with Plan of Care Patient      Patient will benefit from skilled therapeutic intervention in order to improve the following deficits and impairments:  Abnormal gait, Decreased strength, Improper body mechanics, Postural dysfunction, Difficulty walking, Decreased knowledge of use of DME, Decreased mobility, Decreased activity tolerance, Impaired flexibility, Decreased range of motion, Decreased balance  Visit Diagnosis: Difficulty in walking, not elsewhere classified  Muscle weakness (generalized)  Unsteady gait     Problem List Patient Active Problem List   Diagnosis Date Noted  . 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 Acute osteomyelitis of ankle or foot (HCC) 03/09/2016  . Recurrent major depressive disorder, in full remission (HCC) 03/09/2016  . Arthritis 02/11/2016  . Chronic pain 02/11/2016  . Clinical depression 02/11/2016  . Breathlessness on exertion  02/11/2016  . Ulcerative colitis (HCC) 02/11/2016  . H/O deep venous thrombosis 12/30/2015  . Encounter for surgical follow-up care 05/15/2015  . History of artificial joint 05/15/2015  . Type 2 diabetes mellitus (HCC) 05/12/2015  . History of operative procedure on hip 05/13/2014  . Fracture of humerus neck 04/18/2014  . Phlebitis of deep vein of lower extremity (HCC) 04/04/2014  . Hypercholesterolemia 04/11/2013  . BP (high blood pressure) 04/11/2013  . 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 Acquired spondylolisthesis 10/25/2012  . Degeneration of intervertebral disc of lumbosacral region 07/11/2012  . Lumbar canal stenosis 06/20/2012  . Calf muscle weakness 05/31/2012  .  Difficulty in walking 05/31/2012  . Thoracic and lumbosacral neuritis 05/31/2012  . 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 [place], [place], [address] Phone: [phone]   Fax:  [phone]  Name: CRISTIE MCKINNEY MRN: [id number] Date of Birth: [birth year]

## 2016-07-06 NOTE — Addendum Note (Signed)
Addended by: Dorene Grebe C on: 07/06/2016 01:03 PM   Modules accepted: Orders

## 2016-07-06 NOTE — Therapy (Signed)
[facility] [address] [place], [place], [address] Phone: [phone]   Fax:  [phone]  Physical Therapy Evaluation  Patient Details  Name: Alexis Vaughan MRN: [id number] Date of Birth: 07/15/1951 Referring Provider: Dr. Al Corpus  Encounter Date: 06/22/2016    Past Medical History:  Diagnosis Date  . Arthritis   . Bruises easily   . Colitis   . Depression   . Diabetes mellitus without complication (HCC)   . H/O blood clots   . Hypertension   . Poor circulation   . Swelling     Past Surgical History:  Procedure Laterality Date  . BREAST BIOPSY Right    stereo/clip- neg    There were no vitals filed for this visit.     Pt. reports no pain at this time and states 5th toe amputation has gone well.  No issues.  Pt. reports chronic h/o falls and is currently walking with SPC.      There.ex.:  See HEP/ handouts.       Pt. is a pleasant 65 y/o female with chronic h/o surgeries/ decreased mobility since 08/2011 after initial R THA.  Pt. 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  Pt. reports no pain in toe/foot since surgery but limited ambulation and gait sequencing with use of SPC.  Pt. currently participating with [employer] ex. program 2x/week at [facility] (seated ex./ Yoga) and reamins independent at home (fearful of falling).  LEFS: 25 out of 80.  Pt. presents with generalized B LE muscle weakness grossly 4/5 MMT (no c/o pain).  Pt. ambulates with slow, antalgic gait pattern with moderate lateral lean noted and benefits from use of SPC to correct posture/ gait.  Pt. will benefit from skilled PT services to increase B LE muscle strength/ safety with gait to improve overall mobility.           PT Long Term Goals - 06/23/16 1250      PT LONG TERM GOAL #1   Title Pt. I with HEP to increase B LE muscle strength grossly 1/2 muscle grade to improve pain-free mobility/ prevent falls.     Baseline B LE muscle strength  grossly 4/5 MMT.  Generalized muscle fatigue/ overall deconditioning.     Time 4   Period Weeks   Status New     PT LONG TERM GOAL #2   Title Pt. will increase LEFS to >40 out of 80 to improve independence with functional mobility.     Baseline LEFS: 25 out of 80 06/22/16   Time 4   Period Weeks   Status New     PT LONG TERM GOAL #3   Title Pt. will increase Berg balance test to >50/56 to decrease fall risk/ improve independence with gait.     Baseline 44/56 on 7/18   Time 4   Period Weeks   Status New     PT LONG TERM GOAL #4   Title Pt. will be independent with gym based/ ex. program to maintain functional strength/ overall mobility.     Baseline recently started gym ex.   Time 4   Period Weeks   Status New        Patient will benefit from skilled therapeutic intervention in order to improve the following deficits and impairments:  Abnormal gait, Decreased strength, Improper body mechanics, Postural dysfunction, Difficulty walking, Decreased knowledge of use of DME, Decreased mobility, Decreased activity tolerance, Impaired flexibility, Decreased range of motion,  Decreased balance  Visit Diagnosis: Difficulty in walking, not elsewhere classified  Muscle weakness (generalized)  Unsteady gait     Problem List Patient Active Problem List   Diagnosis Date Noted  . 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 Acute osteomyelitis of ankle or foot (HCC) 03/09/2016  . Recurrent major depressive disorder, in full remission (HCC) 03/09/2016  . Arthritis 02/11/2016  . Chronic pain 02/11/2016  . Clinical depression 02/11/2016  . Breathlessness on exertion 02/11/2016  . Ulcerative colitis (HCC) 02/11/2016  . H/O deep venous thrombosis 12/30/2015  . Encounter for surgical follow-up care 05/15/2015  . History of artificial joint 05/15/2015  . Type 2 diabetes mellitus (HCC) 05/12/2015  . History of operative procedure on hip 05/13/2014  . Fracture of  humerus neck 04/18/2014  . Phlebitis of deep vein of lower extremity (HCC) 04/04/2014  . Hypercholesterolemia 04/11/2013  . BP (high blood pressure) 04/11/2013  . 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 Acquired spondylolisthesis 10/25/2012  . Degeneration of intervertebral disc of lumbosacral region 07/11/2012  . Lumbar canal stenosis 06/20/2012  . Calf muscle weakness 05/31/2012  . Difficulty in walking 05/31/2012  . Thoracic and lumbosacral neuritis 05/31/2012  . Coxitis 04/28/2012   Cammie Mcgee, PT, DPT # [id number] 06/23/16,  8:45AM    [facility] [address] [place], [place], [address] Phone: [phone]   Fax:  [phone]  Name: ARSEMA TUSING MRN: [id number] Date of Birth: 09-13-1951

## 2016-07-08 ENCOUNTER — Encounter: Payer: Self-pay | Admitting: Physical Therapy

## 2016-07-08 ENCOUNTER — Encounter: Payer: Medicare Other | Admitting: Physical Therapy

## 2016-07-08 ENCOUNTER — Ambulatory Visit: Payer: Medicare Other | Admitting: Physical Therapy

## 2016-07-08 DIAGNOSIS — R2681 Unsteadiness on feet: Secondary | ICD-10-CM

## 2016-07-08 DIAGNOSIS — M6281 Muscle weakness (generalized): Secondary | ICD-10-CM

## 2016-07-08 DIAGNOSIS — R262 Difficulty in walking, not elsewhere classified: Secondary | ICD-10-CM

## 2016-07-08 NOTE — Therapy (Signed)
[facility] [address] [place], [place], [address] Phone: [phone]   Fax:  [phone]  Physical Therapy Treatment  Patient Details  Name: Alexis Vaughan MRN: [id number] Date of Birth: February 09, 1951 Referring Provider: Dr. Al Corpus  Encounter Date: 07/08/2016      PT End of Session - 07/08/16 1033    Visit Number 6   Number of Visits 8   Date for PT Re-Evaluation 07/20/16   Authorization - Visit Number 6   Authorization - Number of Visits 10   PT Start Time 1030   PT Stop Time 1120   PT Time Calculation (min) 50 min   Activity Tolerance Patient tolerated treatment well;Patient limited by fatigue   Behavior During Therapy [facility] for tasks assessed/performed      Past Medical History:  Diagnosis Date  . Arthritis   . Bruises easily   . Colitis   . Depression   . Diabetes mellitus without complication (HCC)   . H/O blood clots   . Hypertension   . Poor circulation   . Swelling     Past Surgical History:  Procedure Laterality Date  . BREAST BIOPSY Right    stereo/clip- neg    There were no vitals filed for this visit.      Subjective Assessment - 07/08/16 1240    Subjective Pt states that she thinks today is going to be a "good day" where she has energy and is able to perform her exercises well. States that she has been compliant with HEP and keeps a copy at her kitchen counter to remind her. Pt states she doesn't consistently use SPC on L side but is trying to remediate that because she notices the difference in her walking when she uses cane on L side.   Limitations Lifting;Standing;Walking;House hold activities   Patient Stated Goals ambulate with no assistive device safely/ participate with ex. program and no limitations.     Currently in Pain? No/denies      Objective:  Therapeutic Exercise: Nu Step Level 6 - 12 minutes/ambulation in PT gym with cane on L side (warm up). Sit<>stand from low surface with no UE  support 2x15. In // bars: marching x30 BLE with cueing for neutral spine, step ups on 6'' step with UE prn 3x15, toe taps on 6'' step with UE prn 3x15. Lateral walk 4x44ft pt demonstrating difficulty maintaining neutral spine and foot when performing with tendency for lateral trunk lean and ER of leading hip. Standing abd 1x10. Clamshell R 2x10. Bridge with red theraband to promote glute activation. Standing hamstring stretch, ITB stretch, gastroc stretch. Prone quad stretch.     Pt response for medical necessity: Pt demonstrates weakness in bilateral LE with balance/gait deficits. She is able to correct gait mechanics with L sided SPC use but continues to compensate with lateral lean in indep walking. Pt with increased tolerance of exercise      PT Education - 07/08/16 1242    Education provided Yes   Education Details Progressed HEP difficulty; added stretches   Person(s) Educated Patient   Methods Explanation;Demonstration;Handout   Comprehension Verbalized understanding;Returned demonstration             PT Long Term Goals - 06/23/16 1250      PT LONG TERM GOAL #1   Title Pt. I with HEP to increase B LE muscle strength grossly 1/2 muscle grade to improve pain-free mobility/ prevent falls.     Baseline B LE muscle  strength grossly 4/5 MMT.  Generalized muscle fatigue/ overall deconditioning.     Time 4   Period Weeks   Status New     PT LONG TERM GOAL #2   Title Pt. will increase LEFS to >40 out of 80 to improve independence with functional mobility.     Baseline LEFS: 25 out of 80 06/22/16   Time 4   Period Weeks   Status New     PT LONG TERM GOAL #3   Title Pt. will increase Berg balance test to >50/56 to decrease fall risk/ improve independence with gait.     Baseline 44/56 on 7/18   Time 4   Period Weeks   Status New     PT LONG TERM GOAL #4   Title Pt. will be independent with gym based/ ex. program to maintain functional strength/ overall mobility.     Baseline  recently started gym ex.   Time 4   Period Weeks   Status New               Plan - 07/08/16 1243    Clinical Impression Statement Pt demonstrates 3 episodes of loss of balance during stepping task with 6'' step; able to self correct with // bars but requires use of UE to prevent fall. Pt with difficulty performing standing abd tasks including abd AROM and lateral walking due to tendency for ER of hip. Pt demonstrates tightness in bilateral quads/TFL/gastroc/hamstring with noted benefit following stretching program.   Rehab Potential Fair   PT Frequency 2x / week   PT Duration 4 weeks   PT Treatment/Interventions ADLs/Self Care Home Management;Cryotherapy;Patient/family education;Cognitive remediation;Neuromuscular re-education;Balance training;Therapeutic exercise;Therapeutic activities;Functional mobility training;Stair training;Gait training;Manual techniques;Passive range of motion   PT Next Visit Plan Gait training/ B LE muscle strengthening and balance tasks.     PT Home Exercise Plan See HEP   Recommended Other Services [employer].   Consulted and Agree with Plan of Care Patient      Patient will benefit from skilled therapeutic intervention in order to improve the following deficits and impairments:  Abnormal gait, Decreased strength, Improper body mechanics, Postural dysfunction, Difficulty walking, Decreased knowledge of use of DME, Decreased mobility, Decreased activity tolerance, Impaired flexibility, Decreased range of motion, Decreased balance  Visit Diagnosis: Difficulty in walking, not elsewhere classified  Muscle weakness (generalized)  Unsteady gait     Problem List Patient Active Problem List   Diagnosis Date Noted  . 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 Acute osteomyelitis of ankle or foot (HCC) 03/09/2016  . Recurrent major depressive disorder, in full remission (HCC) 03/09/2016  . Arthritis 02/11/2016  . Chronic pain  02/11/2016  . Clinical depression 02/11/2016  . Breathlessness on exertion 02/11/2016  . Ulcerative colitis (HCC) 02/11/2016  . H/O deep venous thrombosis 12/30/2015  . Encounter for surgical follow-up care 05/15/2015  . History of artificial joint 05/15/2015  . Type 2 diabetes mellitus (HCC) 05/12/2015  . History of operative procedure on hip 05/13/2014  . Fracture of humerus neck 04/18/2014  . Phlebitis of deep vein of lower extremity (HCC) 04/04/2014  . Hypercholesterolemia 04/11/2013  . BP (high blood pressure) 04/11/2013  . 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 Acquired spondylolisthesis 10/25/2012  . Degeneration of intervertebral disc of lumbosacral region 07/11/2012  . Lumbar canal stenosis 06/20/2012  . Calf muscle weakness 05/31/2012  . Difficulty in walking 05/31/2012  .  Thoracic and lumbosacral neuritis 05/31/2012  . 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 [place], [place], [address] Phone: [phone]   Fax:  [phone]  Name: Alexis Vaughan MRN: [id number] Date of Birth: 12/02/1951

## 2016-07-13 ENCOUNTER — Ambulatory Visit: Payer: Medicare Other | Admitting: Physical Therapy

## 2016-07-13 ENCOUNTER — Encounter: Payer: Self-pay | Admitting: Physical Therapy

## 2016-07-13 DIAGNOSIS — R2681 Unsteadiness on feet: Secondary | ICD-10-CM

## 2016-07-13 DIAGNOSIS — M6281 Muscle weakness (generalized): Secondary | ICD-10-CM

## 2016-07-13 DIAGNOSIS — R262 Difficulty in walking, not elsewhere classified: Secondary | ICD-10-CM | POA: Diagnosis not present

## 2016-07-13 NOTE — Therapy (Signed)
[facility] [address] [place], [place], [address] Phone: [phone]   Fax:  [phone]  Physical Therapy Treatment  Patient Details  Name: Alexis Vaughan MRN: [id number] Date of Birth: 09-03-1951 Referring Provider: Dr. Al Corpus  Encounter Date: 07/13/2016      PT End of Session - 07/13/16 1706    Visit Number 7   Number of Visits 8   Date for PT Re-Evaluation 07/20/16   Authorization - Visit Number 7   Authorization - Number of Visits 10   PT Start Time 1300   PT Stop Time 1347   PT Time Calculation (min) 47 min   Equipment Utilized During Treatment Gait belt   Activity Tolerance Patient tolerated treatment well;Patient limited by fatigue   Behavior During Therapy [facility] for tasks assessed/performed      Past Medical History:  Diagnosis Date  . Arthritis   . Bruises easily   . Colitis   . Depression   . Diabetes mellitus without complication (HCC)   . H/O blood clots   . Hypertension   . Poor circulation   . Swelling     Past Surgical History:  Procedure Laterality Date  . BREAST BIOPSY Right    stereo/clip- neg    There were no vitals filed for this visit.      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 States that she has been practicing her HEP.    Limitations Lifting;Standing;Walking;House hold activities   Patient Stated Goals ambulate with no assistive device safely/ participate with ex. program and no limitations.     Currently in Pain? No/denies      Therapeutic Exercise: Nu Step Level 6 - 10 minutes (warm up). Sit<>stand from low surface with no UE support 2x15. Mini squat x20 in // bars with emphasis on hip hinge and limited knee flexion In // bars: marching x30 BLE with cueing for neutral spine, step ups on air ex pad with UE prn 3x15. Clamshell R  2x10. Sidelying abd x5; pt unable to perform without substitution; best suited to clamshell with minimal knee flex.  Neuromuscular Re-ed: Cone taps with (anterior/medial/lateral placement) 3x4 sets bilaterally with UE support prn. Pt demonstrates increased difficulty with RLE as stance leg with tendency for lateral/backwards trunk lean. Static balance on air ex pad 3x1 minute with narrow base of support.   Pt response for medical necessity: Pt demonstrates weakness in bilateral LE with balance/gait deficits. She is able to correct gait mechanics with L sided SPC use but continues to compensate with lateral lean in indep walking. Pt with increased tolerance of exercise                PT Education - 07/13/16 1706    Education provided Yes   Education Details Progress HEP difficulty with SLR, bridge, clamshell   Person(s) Educated Patient   Methods Explanation;Demonstration;Handout   Comprehension Verbalized understanding;Returned demonstration             PT Long Term Goals - 06/23/16 1250      PT LONG TERM GOAL #1   Title Pt. I with HEP to increase B LE muscle strength grossly 1/2 muscle grade to improve pain-free mobility/ prevent falls.     Baseline B LE muscle strength grossly 4/5 MMT.  Generalized muscle fatigue/ overall  deconditioning.     Time 4   Period Weeks   Status New     PT LONG TERM GOAL #2   Title Pt. will increase LEFS to >40 out of 80 to improve independence with functional mobility.     Baseline LEFS: 25 out of 80 06/22/16   Time 4   Period Weeks   Status New     PT LONG TERM GOAL #3   Title Pt. will increase Berg balance test to >50/56 to decrease fall risk/ improve independence with gait.     Baseline 44/56 on 7/18   Time 4   Period Weeks   Status New     PT LONG TERM GOAL #4   Title Pt. will be independent with gym based/ ex. program to maintain functional strength/ overall mobility.     Baseline recently started gym ex.   Time 4   Period  Weeks   Status New               Plan - 07/13/16 1707    Clinical Impression Statement Pt demonstrates improved confidence with single leg stance tasks without UE; she has 1 episode of LOB but is able to self correct with // bars. She has tendency for lateral trunk lean and extension in with RLE as stance leg and has difficulty responding to cueing for neutral trunk without losing her balance. Pt unable to perform sidelying hip abd progression due to weakness.    Rehab Potential Fair   PT Frequency 2x / week   PT Duration 4 weeks   PT Treatment/Interventions ADLs/Self Care Home Management;Cryotherapy;Patient/family education;Cognitive remediation;Neuromuscular re-education;Balance training;Therapeutic exercise;Therapeutic activities;Functional mobility training;Stair training;Gait training;Manual techniques;Passive range of motion   PT Next Visit Plan Gait training/ B LE muscle strengthening and balance tasks.     PT Home Exercise Plan See HEP   Consulted and Agree with Plan of Care Patient      Patient will benefit from skilled therapeutic intervention in order to improve the following deficits and impairments:  Abnormal gait, Decreased strength, Improper body mechanics, Postural dysfunction, Difficulty walking, Decreased knowledge of use of DME, Decreased mobility, Decreased activity tolerance, Impaired flexibility, Decreased range of motion, Decreased balance  Visit Diagnosis: Difficulty in walking, not elsewhere classified  Muscle weakness (generalized)  Unsteady gait     Problem List Patient Active Problem List   Diagnosis Date Noted  . 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 Acute osteomyelitis of ankle or foot (HCC) 03/09/2016  . Recurrent major depressive disorder, in full remission (HCC) 03/09/2016  . Arthritis 02/11/2016  . Chronic pain 02/11/2016  . Clinical depression 02/11/2016  . Breathlessness on exertion 02/11/2016  . Ulcerative  colitis (HCC) 02/11/2016  . H/O deep venous thrombosis 12/30/2015  . Encounter for surgical follow-up care 05/15/2015  . History of artificial joint 05/15/2015  . Type 2 diabetes mellitus (HCC) 05/12/2015  . History of operative procedure on hip 05/13/2014  . Fracture of humerus neck 04/18/2014  . Phlebitis of deep vein of lower extremity (HCC) 04/04/2014  . Hypercholesterolemia 04/11/2013  . BP (high blood pressure) 04/11/2013  . 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 Acquired spondylolisthesis 10/25/2012  . Degeneration of intervertebral disc of lumbosacral region 07/11/2012  . Lumbar canal stenosis 06/20/2012  . Calf muscle weakness 05/31/2012  . Difficulty in walking 05/31/2012  . Thoracic and lumbosacral neuritis 05/31/2012  . 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 [place], [place], [address] Phone: [phone]   Fax:  [phone]  Name: Alexis MannsLinda C Vaughan MRN: [id number] Date of Birth: [birth year]

## 2016-07-15 ENCOUNTER — Encounter: Payer: Self-pay | Admitting: Physical Therapy

## 2016-07-15 ENCOUNTER — Ambulatory Visit: Payer: Medicare Other | Admitting: Physical Therapy

## 2016-07-15 DIAGNOSIS — R262 Difficulty in walking, not elsewhere classified: Secondary | ICD-10-CM | POA: Diagnosis not present

## 2016-07-15 DIAGNOSIS — R2681 Unsteadiness on feet: Secondary | ICD-10-CM

## 2016-07-15 DIAGNOSIS — M6281 Muscle weakness (generalized): Secondary | ICD-10-CM

## 2016-07-15 NOTE — Therapy (Signed)
[place] [facility] [address] [place], [place], [address] Phone: [phone]   Fax:  [phone]  Physical Therapy Treatment  Patient Details  Name: RAEDEN BELZER MRN: [id number] Date of Birth: August 30, 1951 Referring Provider: Dr. Milinda Pointer  Encounter Date: 07/15/2016      PT End of Session - 07/15/16 1641    Visit Number 8   Number of Visits 12   Date for PT Re-Evaluation 08/09/16   Authorization - Visit Number 8   Authorization - Number of Visits 10   PT Start Time 1301   PT Stop Time 1344   PT Time Calculation (min) 43 min   Equipment Utilized During Treatment Gait belt   Activity Tolerance Patient tolerated treatment well   Behavior During Therapy [facility] for tasks assessed/performed      Past Medical History:  Diagnosis Date  . Arthritis   . Bruises easily   . Colitis   . Depression   . Diabetes mellitus without complication ([place])   . H/O blood clots   . Hypertension   . Poor circulation   . Swelling     Past Surgical History:  Procedure Laterality Date  . BREAST BIOPSY Right    stereo/clip- neg    There were no vitals filed for this visit.      Subjective Assessment - 07/15/16 1638    Subjective Pt reports that she enjoys coming to physical therapy and feels that she benefits from supervised activity at this time to emphasize LE strengthening program. She reports that she will ask about weight machines at her gym to progress toward eventual independence.   Limitations Lifting;Standing;Walking;House hold activities   Patient Stated Goals ambulate with no assistive device safely/ participate with ex. program and no limitations.     Currently in Pain? No/denies     Objective:   Therapeutic Exercise: Nu Step Level 5 - 10 minutes (warm up). Sit<>stand from low surface with no UE support x15. Amb on treadmill with cueing for upright posture, emphasis on heel strike/foot clearance (5 minutes; 1.30mh). Discussion of  appropriate gym-based program.  Neuromuscular Re-ed: Cone taps with (anterior/medial/lateral placement) 3x4 sets bilaterally with UE support prn. Pt demonstrates increased difficulty with RLE as stance leg with tendency for lateral/backwards trunk lean. Static balance in // bars with narrow base of support 1 minute, tandem stance 30sec x2 alternating R/L as posterior leg, single leg stance R/L pt unable to achieve >1-3 seconds without UE support.  Pt response for medical necessity: Pt demonstrates weakness in bilateral LE with balance/gait deficits. She is able to correct gait mechanics with L sided SPC use but continues to compensate with lateral lean in indep walking. She displays overall increased confidence and functional mobility but continues to have difficulty with single leg stance activities and will benefit from continued therapy to develop safe progression of LE strengthening and supervised balance activities.       PT Long Term Goals - 07/15/16 1646      PT LONG TERM GOAL #1   Title Pt. I with HEP to increase B LE muscle strength grossly 1/2 muscle grade to improve pain-free mobility/ prevent falls.     Baseline B LE muscle strength grossly 4/5 MMT.  Generalized muscle fatigue/ overall deconditioning.     Time 4   Period Weeks   Status Partially Met     PT LONG TERM GOAL #2   Title Pt. will increase LEFS to >40 out of 80 to improve  independence with functional mobility.     Baseline LEFS: 25 out of 80 06/22/16   Time 4   Period Weeks   Status Achieved     PT LONG TERM GOAL #3   Title Pt. will increase Berg balance test to >50/56 to decrease fall risk/ improve independence with gait.     Baseline 44/56 on 7/18   Time 4   Period Weeks   Status Achieved     PT LONG TERM GOAL #4   Title Pt. will be independent with gym based/ ex. program to maintain functional strength/ overall mobility.     Baseline recently started gym ex.   Time 4   Period Weeks   Status Partially Met      PT LONG TERM GOAL #5   Title Pt will achieve single leg stance on BLE for >5 seconds with no UE support   Baseline pt able to achieve 1-5 sec on LLE, <1 sec on RLE   Time 4   Period Weeks   Status New               Plan - 07/15/16 1642    Clinical Impression Statement Pt demonstrates overall improvement in gait strategy with SPC and improvement of outcome measures 45/80 LEFS, 49/56 BERG. She displays better ability and confidence with dynamic balance exercises but continues to have difficulty with single leg stance activities with BLE, most notably with RLE. She will benefit from skilled PT to progress LE strengthening, balance and endurance program with eventual progression to independent home/gym based program.   Rehab Potential Fair   PT Frequency 2x / week   PT Duration 4 weeks   PT Treatment/Interventions ADLs/Self Care Home Management;Cryotherapy;Patient/family education;Cognitive remediation;Neuromuscular re-education;Balance training;Therapeutic exercise;Therapeutic activities;Functional mobility training;Stair training;Gait training;Manual techniques;Passive range of motion   PT Next Visit Plan Gait training/ B LE muscle strengthening and balance tasks. Issue progressive HEP next session. Discuss gym exercises.    PT Home Exercise Plan See HEP   Recommended Other Services [employer]   Consulted and Agree with Plan of Care Patient      Patient will benefit from skilled therapeutic intervention in order to improve the following deficits and impairments:  Abnormal gait, Decreased strength, Improper body mechanics, Postural dysfunction, Difficulty walking, Decreased knowledge of use of DME, Decreased mobility, Decreased activity tolerance, Impaired flexibility, Decreased range of motion, Decreased balance  Visit Diagnosis: Difficulty in walking, not elsewhere classified  Muscle weakness (generalized)  Unsteady gait     Problem List Patient Active Problem List    Diagnosis Date Noted  . Ulcer of toe due to type 2 diabetes mellitus ([place]) 03/30/2016  . Acute osteomyelitis of ankle or foot ([place]) 03/09/2016  . Recurrent major depressive disorder, in full remission ([place]) 03/09/2016  . Arthritis 02/11/2016  . Chronic pain 02/11/2016  . Clinical depression 02/11/2016  . Breathlessness on exertion 02/11/2016  . Ulcerative colitis ([place]) 02/11/2016  . H/O deep venous thrombosis 12/30/2015  . Encounter for surgical follow-up care 05/15/2015  . History of artificial joint 05/15/2015  . Type 2 diabetes mellitus ([place]) 05/12/2015  . History of operative procedure on hip 05/13/2014  . Fracture of humerus neck 04/18/2014  . Phlebitis of deep vein of lower extremity ([place]) 04/04/2014  . Hypercholesterolemia 04/11/2013  . BP (high blood pressure) 04/11/2013  . Infection and inflammatory reaction due to internal prosthetic device, implant, and graft ([place]) 03/20/2013  . Acquired spondylolisthesis 10/25/2012  . Degeneration of intervertebral disc of lumbosacral region  07/11/2012  . Lumbar canal stenosis 06/20/2012  . Calf muscle weakness 05/31/2012  . Difficulty in walking 05/31/2012  . Thoracic and lumbosacral neuritis 05/31/2012  . 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 [place], [place], [address] Phone: [phone]   Fax:  [phone]  Name: BERDA SHELVIN MRN: [id number] Date of Birth: 1951-11-16

## 2016-07-21 ENCOUNTER — Ambulatory Visit: Payer: Medicare Other | Admitting: Physical Therapy

## 2016-07-21 ENCOUNTER — Encounter: Payer: Self-pay | Admitting: Physical Therapy

## 2016-07-21 DIAGNOSIS — R2681 Unsteadiness on feet: Secondary | ICD-10-CM

## 2016-07-21 DIAGNOSIS — M6281 Muscle weakness (generalized): Secondary | ICD-10-CM

## 2016-07-21 DIAGNOSIS — R262 Difficulty in walking, not elsewhere classified: Secondary | ICD-10-CM | POA: Diagnosis not present

## 2016-07-21 NOTE — Therapy (Signed)
[facility] [address] [place], [place], [address] Phone: [phone]   Fax:  [phone]  Physical Therapy Treatment  Patient Details  Name: Alexis Vaughan MRN: [id number] Date of Birth: 05-19-1951 Referring Provider: Dr. Milinda Pointer  Encounter Date: 07/21/2016      PT End of Session - 07/21/16 1436    Visit Number 9   Number of Visits 12   Date for PT Re-Evaluation 08/09/16   Authorization - Visit Number 9   Authorization - Number of Visits 12   PT Start Time 6761   PT Stop Time 1433   PT Time Calculation (min) 50 min   Equipment Utilized During Treatment Gait belt   Activity Tolerance Patient tolerated treatment well;Patient limited by fatigue   Behavior During Therapy [facility] for tasks assessed/performed      Past Medical History:  Diagnosis Date  . Arthritis   . Bruises easily   . Colitis   . Depression   . Diabetes mellitus without complication ([place])   . H/O blood clots   . Hypertension   . Poor circulation   . Swelling     Past Surgical History:  Procedure Laterality Date  . BREAST BIOPSY Right    stereo/clip- neg    There were no vitals filed for this visit.      Subjective Assessment - 07/21/16 1434    Subjective Pt states that she has been busy with packing/moving boxes for her upcoming move and has not been to her gym since last PT visit. States that she is trying to incorporate her exercise program into daily routine and will try to visit the gym 2x before her next PT session.    Limitations Lifting;Standing;Walking;House hold activities   Patient Stated Goals ambulate with no assistive device safely/ participate with ex. program and no limitations.     Currently in Pain? No/denies      Objective:  Therapeutic Exercise: Treadmill 5 minutes, 1.5 mph (warm up HR 110, O2 96). Clamshell 2x10 bilaterally with tactile cueing to prevent compensatory trunk rotation. SLR 2x10 with cueing for slow/control. Bridge  2x10. Hip 3 way (flex/ext/abd) x5 bilateral LE with finger tip hold on // bars promoting strength/stability of stance leg.  Neuromuscular Re-ed: Tandem walks with UE prn 52fx6. Tandem stance 45 sec x4. Cone taps with no UE support requiring CGA/SBA (4 cones x5 bilateral LE).   Pt response for medical necessity: Pt demonstrating improved confidence/balance with single leg stance since initial eval although still with room for improvement. She is able to perform increased repetitions of therapeutic exercises, requiring one rest break following >15 minutes of continuous standing exercise.       PT Long Term Goals - 07/15/16 1646      PT LONG TERM GOAL #1   Title Pt. I with HEP to increase B LE muscle strength grossly 1/2 muscle grade to improve pain-free mobility/ prevent falls.     Baseline B LE muscle strength grossly 4/5 MMT.  Generalized muscle fatigue/ overall deconditioning.     Time 4   Period Weeks   Status Partially Met     PT LONG TERM GOAL #2   Title Pt. will increase LEFS to >40 out of 80 to improve independence with functional mobility.     Baseline LEFS: 25 out of 80 06/22/16   Time 4   Period Weeks   Status Achieved     PT LONG TERM GOAL #3   Title Pt. will  increase Berg balance test to >50/56 to decrease fall risk/ improve independence with gait.     Baseline 44/56 on 7/18   Time 4   Period Weeks   Status Achieved     PT LONG TERM GOAL #4   Title Pt. will be independent with gym based/ ex. program to maintain functional strength/ overall mobility.     Baseline recently started gym ex.   Time 4   Period Weeks   Status Partially Met     PT LONG TERM GOAL #5   Title Pt will achieve single leg stance on BLE for >5 seconds with no UE support   Baseline pt able to achieve 1-5 sec on LLE, <1 sec on RLE   Time 4   Period Weeks   Status New               Plan - 18-Aug-2016 1437    Clinical Impression Statement Pt requires verbal/physical cueing for set up of  clamshell exercise to target glute med secondary to tendency for lumbar rotation. She also has tendency for external rotation of bilateral hips when performing SLR; she will benefit from repeated follow up on HEP to ensure she is performing exercises with proper form. Pt able to tolerate increased exercise intensity during today's session, requiring one seated rest break. Pt continues to display greater difficulty with single leg stance activities on RLE but improved from initial evaluation date. Pt reporting overall improved confidence and greater functional mobility.    Rehab Potential Fair   PT Frequency 2x / week   PT Duration 4 weeks   PT Treatment/Interventions ADLs/Self Care Home Management;Cryotherapy;Patient/family education;Cognitive remediation;Neuromuscular re-education;Balance training;Therapeutic exercise;Therapeutic activities;Functional mobility training;Stair training;Gait training;Manual techniques;Passive range of motion   PT Next Visit Plan Gait training/ B LE muscle strengthening and balance tasks. Issue progressive HEP next session. Discuss gym exercises.    PT Home Exercise Plan See HEP   Recommended Other Services [employer]   Consulted and Agree with Plan of Care Patient      Patient will benefit from skilled therapeutic intervention in order to improve the following deficits and impairments:  Abnormal gait, Decreased strength, Improper body mechanics, Postural dysfunction, Difficulty walking, Decreased knowledge of use of DME, Decreased mobility, Decreased activity tolerance, Impaired flexibility, Decreased range of motion, Decreased balance  Visit Diagnosis: Difficulty in walking, not elsewhere classified  Muscle weakness (generalized)  Unsteady gait       G-Codes - Aug 18, 2016 1819    Functional Assessment Tool Used Clinical impression/ LEFS/ gait difficulty/ muscle weakness   Functional Limitation Mobility: Walking and moving around   Mobility: Walking and  Moving Around Current Status (W2993) At least 20 percent but less than 40 percent impaired, limited or restricted   Mobility: Walking and Moving Around Goal Status [phone]) At least 1 percent but less than 20 percent impaired, limited or restricted      Problem List Patient Active Problem List   Diagnosis Date Noted  . Ulcer of toe due to type 2 diabetes mellitus ([place]) 03/30/2016  . Acute osteomyelitis of ankle or foot ([place]) 03/09/2016  . Recurrent major depressive disorder, in full remission ([place]) 03/09/2016  . Arthritis 02/11/2016  . Chronic pain 02/11/2016  . Clinical depression 02/11/2016  . Breathlessness on exertion 02/11/2016  . Ulcerative colitis ([place]) 02/11/2016  . H/O deep venous thrombosis 12/30/2015  . Encounter for surgical follow-up care 05/15/2015  . History of artificial joint 05/15/2015  . Type 2 diabetes mellitus ([place]) 05/12/2015  .  History of operative procedure on hip 05/13/2014  . Fracture of humerus neck 04/18/2014  . Phlebitis of deep vein of lower extremity ([place]) 04/04/2014  . Hypercholesterolemia 04/11/2013  . BP (high blood pressure) 04/11/2013  . Infection and inflammatory reaction due to internal prosthetic device, implant, and graft ([place]) 03/20/2013  . Acquired spondylolisthesis 10/25/2012  . Degeneration of intervertebral disc of lumbosacral region 07/11/2012  . Lumbar canal stenosis 06/20/2012  . Calf muscle weakness 05/31/2012  . Difficulty in walking 05/31/2012  . Thoracic and lumbosacral neuritis 05/31/2012  . 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 [facility] [address] [place], [place], [address] Phone: [phone]   Fax:  [phone]  Name: ARPI DIEBOLD MRN: [id number] Date of Birth: [birth year]

## 2016-07-28 ENCOUNTER — Encounter: Payer: Self-pay | Admitting: Physical Therapy

## 2016-07-28 ENCOUNTER — Ambulatory Visit: Payer: Medicare Other | Admitting: Physical Therapy

## 2016-07-28 DIAGNOSIS — R2681 Unsteadiness on feet: Secondary | ICD-10-CM

## 2016-07-28 DIAGNOSIS — R262 Difficulty in walking, not elsewhere classified: Secondary | ICD-10-CM

## 2016-07-28 DIAGNOSIS — M6281 Muscle weakness (generalized): Secondary | ICD-10-CM

## 2016-07-28 NOTE — Therapy (Signed)
[facility] [address] [place], [place], [address] Phone: [phone]   Fax:  [phone]  Physical Therapy Treatment  Patient Details  Name: Alexis Vaughan MRN: [id number] Date of Birth: 01/25/1951 Referring Provider: Dr. Milinda Pointer  Encounter Date: 07/28/2016      PT End of Session - 07/28/16 1545    Visit Number 10   Number of Visits 12   Date for PT Re-Evaluation 08/09/16   Authorization - Visit Number 10   Authorization - Number of Visits 12   PT Start Time 1443   PT Stop Time 1433   PT Time Calculation (min) 50 min   Equipment Utilized During Treatment Gait belt   Activity Tolerance Patient tolerated treatment well;Patient limited by fatigue   Behavior During Therapy [facility] for tasks assessed/performed      Past Medical History:  Diagnosis Date  . Arthritis   . Bruises easily   . Colitis   . Depression   . Diabetes mellitus without complication ([place])   . H/O blood clots   . Hypertension   . Poor circulation   . Swelling     Past Surgical History:  Procedure Laterality Date  . BREAST BIOPSY Right    stereo/clip- neg    There were no vitals filed for this visit.      Subjective Assessment - 07/28/16 1353    Subjective Pt states that she has not been able to attend the gym over the last week; she has been busy with moving activities. States she woke up with some mild swelling in left calf; hx of blood clot. States that it is only painful at times but not constant. Will f/u with MD if no improvements/worsening of calf pain/swelling.   Limitations Lifting;Standing;Walking;House hold activities   Patient Stated Goals ambulate with no assistive device safely/ participate with ex. program and no limitations.     Currently in Pain? No/denies      Objective:  10 minutes treadmill walking at 1.5 mph (warm up/no charge)  Neuromuscular Re-ed: Tandem walking in // bars with no UE support 36fx4. Modified [place]vine with  forward cross 158f4. Modified [place] with backward cross 1252f. Dynamic forward/diagonal/side stepping with no UE support 19f36f. Lateral walks 19ft64fForward/backward step ups on 3'' with UE support prn (progressing towards light touch) x30.   Pt response for medical necessity: Pt presents for physical therapy treatment with mild pain/swelling in back of calf. She is negative for homan's with no TTP and does not report increase in pain/change in sx with exercise. Pt demonstrates difficulty with dynamic single leg tasks with bilateral LE.        PT Long Term Goals - 07/15/16 1646      PT LONG TERM GOAL #1   Title Pt. I with HEP to increase B LE muscle strength grossly 1/2 muscle grade to improve pain-free mobility/ prevent falls.     Baseline B LE muscle strength grossly 4/5 MMT.  Generalized muscle fatigue/ overall deconditioning.     Time 4   Period Weeks   Status Partially Met     PT LONG TERM GOAL #2   Title Pt. will increase LEFS to >40 out of 80 to improve independence with functional mobility.     Baseline LEFS: 25 out of 80 06/22/16   Time 4   Period Weeks   Status Achieved     PT LONG TERM GOAL #3   Title Pt. will increase Berg balance test  to >50/56 to decrease fall risk/ improve independence with gait.     Baseline 44/56 on 7/18   Time 4   Period Weeks   Status Achieved     PT LONG TERM GOAL #4   Title Pt. will be independent with gym based/ ex. program to maintain functional strength/ overall mobility.     Baseline recently started gym ex.   Time 4   Period Weeks   Status Partially Met     PT LONG TERM GOAL #5   Title Pt will achieve single leg stance on BLE for >5 seconds with no UE support   Baseline pt able to achieve 1-5 sec on LLE, <1 sec on RLE   Time 4   Period Weeks   Status New               Plan - 07/28/16 1549    Clinical Impression Statement Pt has been unable to perform independent home exercise program due to difficulty with home  responsibilities. She demonstrates mild-moderate coordination/SLS stability deficits. Pt performs 10 minutes of treadmill walking at 1.5 mph requiring short rest break before resuming therapeutic exercises. Pt shows overall improvement with single leg stance but continues to have difficulty maintaing balance with dynamic tasks.   Rehab Potential Fair   PT Frequency 1x / week   PT Duration 4 weeks   PT Treatment/Interventions ADLs/Self Care Home Management;Cryotherapy;Patient/family education;Cognitive remediation;Neuromuscular re-education;Balance training;Therapeutic exercise;Therapeutic activities;Functional mobility training;Stair training;Gait training;Manual techniques;Passive range of motion   PT Next Visit Plan Gait training/ B LE muscle strengthening and balance tasks.   PT Home Exercise Plan See HEP   Consulted and Agree with Plan of Care Patient      Patient will benefit from skilled therapeutic intervention in order to improve the following deficits and impairments:  Abnormal gait, Decreased strength, Improper body mechanics, Postural dysfunction, Difficulty walking, Decreased knowledge of use of DME, Decreased mobility, Decreased activity tolerance, Impaired flexibility, Decreased range of motion, Decreased balance  Visit Diagnosis: Difficulty in walking, not elsewhere classified  Muscle weakness (generalized)  Unsteady gait     Problem List Patient Active Problem List   Diagnosis Date Noted  . Ulcer of toe due to type 2 diabetes mellitus ([place]) 03/30/2016  . Acute osteomyelitis of ankle or foot ([place]) 03/09/2016  . Recurrent major depressive disorder, in full remission ([place]) 03/09/2016  . Arthritis 02/11/2016  . Chronic pain 02/11/2016  . Clinical depression 02/11/2016  . Breathlessness on exertion 02/11/2016  . Ulcerative colitis ([place]) 02/11/2016  . H/O deep venous thrombosis 12/30/2015  . Encounter for surgical follow-up care 05/15/2015  . History of artificial joint  05/15/2015  . Type 2 diabetes mellitus ([place]) 05/12/2015  . History of operative procedure on hip 05/13/2014  . Fracture of humerus neck 04/18/2014  . Phlebitis of deep vein of lower extremity ([place]) 04/04/2014  . Hypercholesterolemia 04/11/2013  . BP (high blood pressure) 04/11/2013  . Infection and inflammatory reaction due to internal prosthetic device, implant, and graft ([place]) 03/20/2013  . Acquired spondylolisthesis 10/25/2012  . Degeneration of intervertebral disc of lumbosacral region 07/11/2012  . Lumbar canal stenosis 06/20/2012  . Calf muscle weakness 05/31/2012  . Difficulty in walking 05/31/2012  . Thoracic and lumbosacral neuritis 05/31/2012  . 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 [place], [place], [address] Phone: [phone]   Fax:  [phone]  Name: Alexis Vaughan MRN: [id number]  Date of Birth: June 17, 1951

## 2016-08-04 ENCOUNTER — Encounter: Payer: Self-pay | Admitting: Physical Therapy

## 2016-08-04 ENCOUNTER — Ambulatory Visit: Payer: Medicare Other | Admitting: Physical Therapy

## 2016-08-04 DIAGNOSIS — M6281 Muscle weakness (generalized): Secondary | ICD-10-CM

## 2016-08-04 DIAGNOSIS — R262 Difficulty in walking, not elsewhere classified: Secondary | ICD-10-CM

## 2016-08-04 DIAGNOSIS — R2681 Unsteadiness on feet: Secondary | ICD-10-CM

## 2016-08-05 NOTE — Therapy (Signed)
[place] [facility] [address] [place], [place], [address] Phone: [phone]   Fax:  [phone]  Physical Therapy Treatment  Patient Details  Name: Alexis Vaughan MRN: [id number] Date of Birth: 03-Nov-1951 Referring Provider: Dr. Milinda Pointer  Encounter Date: 08/04/2016      PT End of Session - 08/05/16 1744    Visit Number 11   Number of Visits 12   Date for PT Re-Evaluation 08/09/16   Authorization - Visit Number 11   Authorization - Number of Visits 12   PT Start Time 3532   PT Stop Time 1441   PT Time Calculation (min) 59 min   Equipment Utilized During Treatment Gait belt   Activity Tolerance Patient tolerated treatment well;Patient limited by fatigue   Behavior During Therapy [facility] for tasks assessed/performed      Past Medical History:  Diagnosis Date  . Arthritis   . Bruises easily   . Colitis   . Depression   . Diabetes mellitus without complication ([place])   . H/O blood clots   . Hypertension   . Poor circulation   . Swelling     Past Surgical History:  Procedure Laterality Date  . BREAST BIOPSY Right    stereo/clip- neg    There were no vitals filed for this visit.      Subjective Assessment - 08/04/16 1353    Subjective Pt. continues to be too busy to participate with gym based ex.  No increase c/o calf pain.     Limitations Lifting;Standing;Walking;House hold activities   Patient Stated Goals ambulate with no assistive device safely/ participate with ex. program and no limitations.     Currently in Pain? No/denies       Objective:  10 minutes treadmill walking at 1.5 mph (warm up/no charge)  Neuromuscular Re-ed: Tandem walking in // bars with no UE support 76fx4. Modified grapevine with forward cross 123f4. Modified grapevine with backward cross 1273f. Dynamic forward/diagonal/side stepping with no UE support 57f89f. Lateral walks 57ft58fForward/backward step ups on 3'' with UE support prn (progressing  towards light touch) x30. Functional reaching/ turning tasks in //-bars with min. To no UE assist.   Pt response for medical necessity: Pt presents for physical therapy treatment with mild pain/swelling in back of calf. Pt demonstrates difficulty with dynamic single leg tasks with bilateral LE.        PT Long Term Goals - 07/15/16 1646      PT LONG TERM GOAL #1   Title Pt. I with HEP to increase B LE muscle strength grossly 1/2 muscle grade to improve pain-free mobility/ prevent falls.     Baseline B LE muscle strength grossly 4/5 MMT.  Generalized muscle fatigue/ overall deconditioning.     Time 4   Period Weeks   Status Partially Met     PT LONG TERM GOAL #2   Title Pt. will increase LEFS to >40 out of 80 to improve independence with functional mobility.     Baseline LEFS: 25 out of 80 06/22/16   Time 4   Period Weeks   Status Achieved     PT LONG TERM GOAL #3   Title Pt. will increase Berg balance test to >50/56 to decrease fall risk/ improve independence with gait.     Baseline 44/56 on 7/18   Time 4   Period Weeks   Status Achieved     PT LONG TERM GOAL #4   Title Pt. will be  independent with gym based/ ex. program to maintain functional strength/ overall mobility.     Baseline recently started gym ex.   Time 4   Period Weeks   Status Partially Met     PT LONG TERM GOAL #5   Title Pt will achieve single leg stance on BLE for >5 seconds with no UE support   Baseline pt able to achieve 1-5 sec on LLE, <1 sec on RLE   Time 4   Period Weeks   Status New            Plan - 08/05/16 1746    Clinical Impression Statement Pt. showing slow but consistent progress with gait and balance tasks.  Pt. remains limited with mild to moderate coordination/ SLS stability ex.  Pt. will benefit from continued dynamic tasks/ gait training/ LE muscle strengthening.     Rehab Potential Fair   PT Frequency 1x / week   PT Duration 4 weeks   PT Treatment/Interventions ADLs/Self Care  Home Management;Cryotherapy;Patient/family education;Cognitive remediation;Neuromuscular re-education;Balance training;Therapeutic exercise;Therapeutic activities;Functional mobility training;Stair training;Gait training;Manual techniques;Passive range of motion   PT Next Visit Plan Gait training/ B LE muscle strengthening and balance tasks.  Recert next. tx.    PT Home Exercise Plan See HEP   Consulted and Agree with Plan of Care Patient      Patient will benefit from skilled therapeutic intervention in order to improve the following deficits and impairments:  Abnormal gait, Decreased strength, Improper body mechanics, Postural dysfunction, Difficulty walking, Decreased knowledge of use of DME, Decreased mobility, Decreased activity tolerance, Impaired flexibility, Decreased range of motion, Decreased balance  Visit Diagnosis: Difficulty in walking, not elsewhere classified  Muscle weakness (generalized)  Unsteady gait     Problem List Patient Active Problem List   Diagnosis Date Noted  . Ulcer of toe due to type 2 diabetes mellitus ([place]) 03/30/2016  . Acute osteomyelitis of ankle or foot ([place]) 03/09/2016  . Recurrent major depressive disorder, in full remission ([place]) 03/09/2016  . Arthritis 02/11/2016  . Chronic pain 02/11/2016  . Clinical depression 02/11/2016  . Breathlessness on exertion 02/11/2016  . Ulcerative colitis ([place]) 02/11/2016  . H/O deep venous thrombosis 12/30/2015  . Encounter for surgical follow-up care 05/15/2015  . History of artificial joint 05/15/2015  . Type 2 diabetes mellitus ([place]) 05/12/2015  . History of operative procedure on hip 05/13/2014  . Fracture of humerus neck 04/18/2014  . Phlebitis of deep vein of lower extremity ([place]) 04/04/2014  . Hypercholesterolemia 04/11/2013  . BP (high blood pressure) 04/11/2013  . Infection and inflammatory reaction due to internal prosthetic device, implant, and graft ([place]) 03/20/2013  . Acquired  spondylolisthesis 10/25/2012  . Degeneration of intervertebral disc of lumbosacral region 07/11/2012  . Lumbar canal stenosis 06/20/2012  . Calf muscle weakness 05/31/2012  . Difficulty in walking 05/31/2012  . Thoracic and lumbosacral neuritis 05/31/2012  . Coxitis 04/28/2012   Pura Spice, PT, DPT # [id number]  08/05/2016, 5:50 PM  [place] [facility] [address] [place], [place], [address] Phone: [phone]   Fax:  [phone]  Name: Alexis Vaughan MRN: [id number] Date of Birth: February 26, 1951

## 2016-08-11 ENCOUNTER — Encounter: Payer: Self-pay | Admitting: Physical Therapy

## 2016-08-11 ENCOUNTER — Ambulatory Visit: Payer: Medicare Other | Attending: Podiatry | Admitting: Physical Therapy

## 2016-08-11 DIAGNOSIS — R2681 Unsteadiness on feet: Secondary | ICD-10-CM | POA: Diagnosis present

## 2016-08-11 DIAGNOSIS — M6281 Muscle weakness (generalized): Secondary | ICD-10-CM | POA: Diagnosis present

## 2016-08-11 DIAGNOSIS — R262 Difficulty in walking, not elsewhere classified: Secondary | ICD-10-CM | POA: Diagnosis present

## 2016-08-11 NOTE — Therapy (Signed)
[place] [facility] [address] [place], [place], [address] Phone: [phone]   Fax:  [phone]  Physical Therapy Treatment  Patient Details  Name: Alexis Vaughan MRN: [id number] Date of Birth: 1951/02/11 Referring Provider: Dr. Milinda Pointer  Encounter Date: 08/11/2016      PT End of Session - 08/11/16 1751    Visit Number 12   Number of Visits 13   Date for PT Re-Evaluation 08/26/16   Authorization - Visit Number 11   Authorization - Number of Visits 20   PT Start Time 7579   PT Stop Time 1444   PT Time Calculation (min) 61 min   Equipment Utilized During Treatment Gait belt   Activity Tolerance Patient tolerated treatment well;Patient limited by fatigue   Behavior During Therapy [facility] for tasks assessed/performed      Past Medical History:  Diagnosis Date  . Arthritis   . Bruises easily   . Colitis   . Depression   . Diabetes mellitus without complication ([place])   . H/O blood clots   . Hypertension   . Poor circulation   . Swelling     Past Surgical History:  Procedure Laterality Date  . BREAST BIOPSY Right    stereo/clip- neg    There were no vitals filed for this visit.      Subjective Assessment - 08/11/16 1750    Subjective Pt reports that she has been to the gym in the last week. She states she is using the stair stepper and treadmill regularly but has some difficulty with set up with attempts to use recumbent bike.    Limitations Lifting;Standing;Walking;House hold activities   Patient Stated Goals ambulate with no assistive device safely/ participate with ex. program and no limitations.     Currently in Pain? No/denies     Objective:   Neuromuscular Re-ed: Coordination drills on floor ladder diagonal/backwards/forwards step 53f x10 with no UE support SPT CGA/SBA. Marching on floor ladder with exaggerated hip flexion 141fx6. Lateral step with emphasis on exaggerated hip flexion 12102f6. Modified grape vine  (lateral step/forward cross) 65f84f.   Therapeutic exercise: Treadmill 10 minutes 1.5 mph (warm up/no charge). SciFit 5 minutes Level 4 (no charge). SLR 1x10 BLE with emphasis on slow control. Pt experiences cramping in R hamstring with hooklying position.    Pt response for medical necessity: Pt requires several seated rest breaks following coordination drills. She experiences 1 LOB requiring min A SPT assist to steady after she is fatigued. She responds to cueing for control/less emphasis on speed with all tasks. Pt will progress toward independent gym program and will f/u with PT for one more visit in two weeks to discuss progress and finalize home program.       PT Long Term Goals - 08/11/16 1408      PT LONG TERM GOAL #1   Title Pt. I with HEP to increase B LE muscle strength grossly 1/2 muscle grade to improve pain-free mobility/ prevent falls.     Baseline B LE muscle strength grossly 4/5 MMT.  Generalized muscle fatigue/ overall deconditioning. 9/6: Met all except hip flexion 4/4, hip abduction 4/4   Time 4   Period Weeks   Status Partially Met     PT LONG TERM GOAL #2   Title Pt. will increase LEFS to >40 out of 80 to improve independence with functional mobility.     Baseline LEFS: 25 out of 80 06/22/16. 9/6: 32/80  Time 4   Period Weeks   Status Partially Met     PT LONG TERM GOAL #3   Title Pt. will increase Berg balance test to >50/56 to decrease fall risk/ improve independence with gait.     Baseline 44/56 on 7/18. 49/56 on 2023/08/22.   Time 4   Period Weeks   Status Partially Met     PT LONG TERM GOAL #4   Title Pt. will be independent with gym based/ ex. program to maintain functional strength/ overall mobility.     Baseline recently started gym ex.   Time 4   Period Weeks   Status Partially Met     PT LONG TERM GOAL #5   Title Pt will achieve single leg stance on BLE for >5 seconds with no UE support   Baseline pt able to achieve 1-5 sec on LLE, <1 sec on RLE Aug 22, 2023:  pt able to achieve >3 sec on RLE   Time 4   Period Weeks   Status Partially Met            Plan - 08-21-2016 1752    Clinical Impression Statement Pt demonstrates improvement in LE strength and functional mobility. She continues to demonstrate deficits in single leg stance on bilateral LE. She ambulates with and without SPC at this time; she is safe with indep ambulation although demonstrates more functional gait pattern (decreased lateral trunk lean/hip hike) when [facility] is used. Pt is progressing towards independent management of strengthening program; she will f/u with PT in two weeks after regular practice of gym program to ensure good understanding/tolerance of exercise program.   Rehab Potential Good   PT Frequency 1x / week   PT Duration 2 weeks   PT Treatment/Interventions ADLs/Self Care Home Management;Cryotherapy;Patient/family education;Cognitive remediation;Neuromuscular re-education;Balance training;Therapeutic exercise;Therapeutic activities;Functional mobility training;Stair training;Gait training;Manual techniques;Passive range of motion   PT Next Visit Plan Gait training/ B LE muscle strengthening and balance tasks.  Recert next. tx.    PT Home Exercise Plan See HEP   Consulted and Agree with Plan of Care Patient      Patient will benefit from skilled therapeutic intervention in order to improve the following deficits and impairments:  Abnormal gait, Decreased strength, Improper body mechanics, Postural dysfunction, Difficulty walking, Decreased knowledge of use of DME, Decreased mobility, Decreased activity tolerance, Impaired flexibility, Decreased range of motion, Decreased balance  Visit Diagnosis: Difficulty in walking, not elsewhere classified  Muscle weakness (generalized)  Unsteady gait       G-Codes - 08-21-2016 1158    Functional Assessment Tool Used Clinical impression/ LEFS/ gait difficulty/ muscle weakness   Functional Limitation Mobility: Walking and  moving around   Mobility: Walking and Moving Around Current Status (Q9450) At least 20 percent but less than 40 percent impaired, limited or restricted   Mobility: Walking and Moving Around Goal Status [phone]) At least 1 percent but less than 20 percent impaired, limited or restricted      Problem List Patient Active Problem List   Diagnosis Date Noted  . Ulcer of toe due to type 2 diabetes mellitus ([place]) 03/30/2016  . Acute osteomyelitis of ankle or foot ([place]) 03/09/2016  . Recurrent major depressive disorder, in full remission ([place]) 03/09/2016  . Arthritis 02/11/2016  . Chronic pain 02/11/2016  . Clinical depression 02/11/2016  . Breathlessness on exertion 02/11/2016  . Ulcerative colitis ([place]) 02/11/2016  . H/O deep venous thrombosis 12/30/2015  . Encounter for surgical follow-up care 05/15/2015  . History of  artificial joint 05/15/2015  . Type 2 diabetes mellitus ([place]) 05/12/2015  . History of operative procedure on hip 05/13/2014  . Fracture of humerus neck 04/18/2014  . Phlebitis of deep vein of lower extremity ([place]) 04/04/2014  . Hypercholesterolemia 04/11/2013  . BP (high blood pressure) 04/11/2013  . Infection and inflammatory reaction due to internal prosthetic device, implant, and graft ([place]) 03/20/2013  . Acquired spondylolisthesis 10/25/2012  . Degeneration of intervertebral disc of lumbosacral region 07/11/2012  . Lumbar canal stenosis 06/20/2012  . Calf muscle weakness 05/31/2012  . Difficulty in walking 05/31/2012  . Thoracic and lumbosacral neuritis 05/31/2012  . 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

## 2016-08-25 ENCOUNTER — Encounter: Payer: Self-pay | Admitting: Physical Therapy

## 2016-08-25 ENCOUNTER — Ambulatory Visit: Payer: Medicare Other | Admitting: Physical Therapy

## 2016-08-25 DIAGNOSIS — R262 Difficulty in walking, not elsewhere classified: Secondary | ICD-10-CM | POA: Diagnosis not present

## 2016-08-25 DIAGNOSIS — M6281 Muscle weakness (generalized): Secondary | ICD-10-CM

## 2016-08-25 DIAGNOSIS — R2681 Unsteadiness on feet: Secondary | ICD-10-CM

## 2016-08-25 NOTE — Therapy (Signed)
[place] [facility] [address] [place], [place], [address] Phone: [phone]   Fax:  [phone]  Physical Therapy Treatment/Discharge  Patient Details  Name: Alexis Vaughan MRN: [id number] Date of Birth: [birth year] Referring Provider: Dr. Milinda Pointer  Encounter Date: 08/25/2016      PT End of Session - 08/25/16 1658    Visit Number 13   Number of Visits 13   Date for PT Re-Evaluation 08/26/16   Authorization - Visit Number 13   Authorization - Number of Visits 20   PT Start Time 9741   PT Stop Time 1447   PT Time Calculation (min) 51 min   Equipment Utilized During Treatment Gait belt   Activity Tolerance Patient tolerated treatment well;Patient limited by fatigue   Behavior During Therapy [facility] for tasks assessed/performed      Past Medical History:  Diagnosis Date  . Arthritis   . Bruises easily   . Colitis   . Depression   . Diabetes mellitus without complication ([place])   . H/O blood clots   . Hypertension   . Poor circulation   . Swelling     Past Surgical History:  Procedure Laterality Date  . BREAST BIOPSY Right    stereo/clip- neg    There were no vitals filed for this visit.      Subjective Assessment - 08/25/16 1656    Subjective Pt states that she has not been able to keep up with her gym program over the last week. She states that she experienced R knee pain when getting up out of a chair a few days ago; states she is experiencing mild pain but it is intermittent and has decreased since initial incident.   Limitations Lifting;Standing;Walking;House hold activities   Patient Stated Goals ambulate with no assistive device safely/ participate with ex. program and no limitations.     Currently in Pain? No/denies     Objective:  Therapeutic Exercise: Nu Step 10 minutes Level 7 (warm up/no charge). Standing marching in // bars x30 R/L with UE support. Mini squat with emphasis on hip hinge and cueing to avoid  excessive knee flexion x30. Lateral walks with mini squat 11f x4. Partial lunge x10 in // bars with UE support as needed for balance.  Neuromuscular Re-ed: In // bars with SPT SBA: standing marching x30 with no UE assist. Tandem stance R/L leg posterior x3 ea. 1 minute trials.    Pt response for medical necessity: Pt tolerates all exercises with no c/o of pain. She is able to self correct during balance exercises with LE or UE assist and is safe with all home program exercises. Pt will begin independent management of endurance/strengthening program and f/u with PT as needed.      PT Education - 08/25/16 1657    Education provided Yes   Education Details HEP: progressive HEP in standing   Person(s) Educated Patient   Methods Explanation;Demonstration;Handout   Comprehension Verbalized understanding;Returned demonstration             PT Long Term Goals - 08/25/16 1700      PT LONG TERM GOAL #1   Title Pt. I with HEP to increase B LE muscle strength grossly 1/2 muscle grade to improve pain-free mobility/ prevent falls.     Baseline B LE muscle strength grossly 4/5 MMT.  Generalized muscle fatigue/ overall deconditioning. 9/6: Met all except hip flexion 4/4, hip abduction 4/4   Time 4   Period Weeks  Status Partially Met     PT LONG TERM GOAL #2   Title Pt. will increase LEFS to >40 out of 80 to improve independence with functional mobility.     Baseline LEFS: 25 out of 80 06/22/16. 9/6: 32/80   Time 4   Period Weeks   Status Partially Met     PT LONG TERM GOAL #3   Title Pt. will increase Berg balance test to >50/56 to decrease fall risk/ improve independence with gait.     Baseline 44/56 on 7/18. 49/56 on 9/6.   Time 4   Period Weeks   Status Partially Met     PT LONG TERM GOAL #4   Title Pt. will be independent with gym based/ ex. program to maintain functional strength/ overall mobility.     Baseline recently started gym ex.   Time 4   Period Weeks   Status Achieved      PT LONG TERM GOAL #5   Title Pt will achieve single leg stance on BLE for >5 seconds with no UE support   Baseline pt able to achieve 1-5 sec on LLE, <1 sec on RLE 9/6: pt able to achieve >3 sec on RLE   Time 4   Period Weeks   Status Achieved               Plan - September 15, 2016 1658    Clinical Impression Statement Pt demonstrates good understanding of progressive HEP program. She continues to be limited by overall endurance and has difficulty with single leg balance tasks but is independent with home program to progress balance and LE strength. Pt will f/u with PT with any future concerns/questions.   Rehab Potential Good   PT Frequency 1x / week   PT Duration 2 weeks   PT Treatment/Interventions ADLs/Self Care Home Management;Cryotherapy;Patient/family education;Cognitive remediation;Neuromuscular re-education;Balance training;Therapeutic exercise;Therapeutic activities;Functional mobility training;Stair training;Gait training;Manual techniques;Passive range of motion   PT Home Exercise Plan See HEP   Consulted and Agree with Plan of Care Patient      Patient will benefit from skilled therapeutic intervention in order to improve the following deficits and impairments:  Abnormal gait, Decreased strength, Improper body mechanics, Postural dysfunction, Difficulty walking, Decreased knowledge of use of DME, Decreased mobility, Decreased activity tolerance, Impaired flexibility, Decreased range of motion, Decreased balance  Visit Diagnosis: Difficulty in walking, not elsewhere classified  Muscle weakness (generalized)  Unsteady gait       G-Codes - 2016-09-15 2002    Functional Assessment Tool Used Clinical impression/ LEFS/ gait difficulty/ muscle weakness   Functional Limitation Mobility: Walking and moving around   Mobility: Walking and Moving Around Current Status [phone]) At least 1 percent but less than 20 percent impaired, limited or restricted   Mobility: Walking and  Moving Around Goal Status [phone]) At least 1 percent but less than 20 percent impaired, limited or restricted   Mobility: Walking and Moving Around Discharge Status [phone]) At least 1 percent but less than 20 percent impaired, limited or restricted      Problem List Patient Active Problem List   Diagnosis Date Noted  . Ulcer of toe due to type 2 diabetes mellitus ([place]) 03/30/2016  . Acute osteomyelitis of ankle or foot ([place]) 03/09/2016  . Recurrent major depressive disorder, in full remission ([place]) 03/09/2016  . Arthritis 02/11/2016  . Chronic pain 02/11/2016  . Clinical depression 02/11/2016  . Breathlessness on exertion 02/11/2016  . Ulcerative colitis ([place]) 02/11/2016  . H/O deep venous thrombosis  12/30/2015  . Encounter for surgical follow-up care 05/15/2015  . History of artificial joint 05/15/2015  . Type 2 diabetes mellitus ([place]) 05/12/2015  . History of operative procedure on hip 05/13/2014  . Fracture of humerus neck 04/18/2014  . Phlebitis of deep vein of lower extremity ([place]) 04/04/2014  . Hypercholesterolemia 04/11/2013  . BP (high blood pressure) 04/11/2013  . Infection and inflammatory reaction due to internal prosthetic device, implant, and graft ([place]) 03/20/2013  . Acquired spondylolisthesis 10/25/2012  . Degeneration of intervertebral disc of lumbosacral region 07/11/2012  . Lumbar canal stenosis 06/20/2012  . Calf muscle weakness 05/31/2012  . Difficulty in walking 05/31/2012  . Thoracic and lumbosacral neuritis 05/31/2012  . 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

## 2017-04-22 ENCOUNTER — Encounter: Payer: Self-pay | Admitting: *Deleted

## 2017-04-25 ENCOUNTER — Encounter: Payer: Self-pay | Admitting: *Deleted

## 2017-04-25 ENCOUNTER — Ambulatory Visit: Payer: Medicare Other | Admitting: Anesthesiology

## 2017-04-25 ENCOUNTER — Encounter
Admission: RE | Disposition: A | Discharge status: Home / Self Care | Payer: Self-pay | Source: Ambulatory Visit | Attending: Unknown Physician Specialty

## 2017-04-25 ENCOUNTER — Ambulatory Visit
Admission: RE | Admit: 2017-04-25 | Discharge: 2017-04-25 | Disposition: A | Discharge status: Home / Self Care | Payer: Medicare Other | Source: Ambulatory Visit | Attending: Unknown Physician Specialty | Admitting: Unknown Physician Specialty

## 2017-04-25 DIAGNOSIS — Z794 Long term (current) use of insulin: Secondary | ICD-10-CM | POA: Insufficient documentation

## 2017-04-25 DIAGNOSIS — E119 Type 2 diabetes mellitus without complications: Secondary | ICD-10-CM | POA: Diagnosis not present

## 2017-04-25 DIAGNOSIS — Z1211 Encounter for screening for malignant neoplasm of colon: Secondary | ICD-10-CM | POA: Insufficient documentation

## 2017-04-25 DIAGNOSIS — N179 Acute kidney failure, unspecified: Secondary | ICD-10-CM | POA: Diagnosis not present

## 2017-04-25 DIAGNOSIS — Z79899 Other long term (current) drug therapy: Secondary | ICD-10-CM | POA: Diagnosis not present

## 2017-04-25 DIAGNOSIS — M1990 Unspecified osteoarthritis, unspecified site: Secondary | ICD-10-CM | POA: Diagnosis not present

## 2017-04-25 DIAGNOSIS — Z7901 Long term (current) use of anticoagulants: Secondary | ICD-10-CM | POA: Insufficient documentation

## 2017-04-25 DIAGNOSIS — Z86010 Personal history of colonic polyps: Secondary | ICD-10-CM | POA: Diagnosis not present

## 2017-04-25 DIAGNOSIS — I739 Peripheral vascular disease, unspecified: Secondary | ICD-10-CM | POA: Insufficient documentation

## 2017-04-25 DIAGNOSIS — I10 Essential (primary) hypertension: Secondary | ICD-10-CM | POA: Diagnosis not present

## 2017-04-25 DIAGNOSIS — K640 First degree hemorrhoids: Secondary | ICD-10-CM | POA: Insufficient documentation

## 2017-04-25 DIAGNOSIS — K5730 Diverticulosis of large intestine without perforation or abscess without bleeding: Secondary | ICD-10-CM | POA: Diagnosis not present

## 2017-04-25 DIAGNOSIS — F329 Major depressive disorder, single episode, unspecified: Secondary | ICD-10-CM | POA: Insufficient documentation

## 2017-04-25 DIAGNOSIS — E7800 Pure hypercholesterolemia, unspecified: Secondary | ICD-10-CM | POA: Diagnosis not present

## 2017-04-25 HISTORY — DX: Cardiac murmur, unspecified: R01.1

## 2017-04-25 HISTORY — DX: Pure hypercholesterolemia, unspecified: E78.00

## 2017-04-25 HISTORY — DX: Acute embolism and thrombosis of unspecified deep veins of unspecified lower extremity: I82.409

## 2017-04-25 HISTORY — PX: COLONOSCOPY WITH PROPOFOL: SHX5780

## 2017-04-25 HISTORY — DX: Dyspnea, unspecified: R06.00

## 2017-04-25 HISTORY — DX: Ulcerative colitis, unspecified, without complications: K51.90

## 2017-04-25 HISTORY — DX: Acute kidney failure, unspecified: N17.9

## 2017-04-25 LAB — GLUCOSE, CAPILLARY: GLUCOSE-CAPILLARY: 172 mg/dL — AB (ref 65–99)

## 2017-04-25 SURGERY — COLONOSCOPY WITH PROPOFOL
Anesthesia: General

## 2017-04-25 MED ORDER — SODIUM CHLORIDE 0.9 % IV SOLN: INTRAVENOUS | Status: DC

## 2017-04-25 MED ORDER — PROPOFOL 500 MG/50ML IV EMUL
INTRAVENOUS | Status: DC | PRN
  Administered 2017-04-25: 150 ug/kg/min via INTRAVENOUS

## 2017-04-25 MED ORDER — PHENYLEPHRINE HCL 10 MG/ML IJ SOLN
INTRAMUSCULAR | Status: AC
  Filled 2017-04-25: qty 1

## 2017-04-25 MED ORDER — PIPERACILLIN-TAZOBACTAM 3.375 G IVPB 30 MIN
3.3750 g | Freq: Once | INTRAVENOUS | Status: AC
  Administered 2017-04-25: 3.375 g via INTRAVENOUS
  Filled 2017-04-25: qty 50

## 2017-04-25 MED ORDER — PROPOFOL 500 MG/50ML IV EMUL
INTRAVENOUS | Status: AC
  Filled 2017-04-25: qty 50

## 2017-04-25 MED ORDER — SODIUM CHLORIDE 0.9 % IV SOLN
INTRAVENOUS | Status: DC
  Administered 2017-04-25: 07:00:00 via INTRAVENOUS

## 2017-04-25 MED ORDER — PROPOFOL 10 MG/ML IV BOLUS
INTRAVENOUS | Status: DC | PRN
  Administered 2017-04-25: 60 mg via INTRAVENOUS

## 2017-04-25 NOTE — H&P (Signed)
Primary Care Physician:  Lynnea FerrierKlein, Bert J III, MD Primary Gastroenterologist:  Dr. Mechele CollinElliott  Pre-Procedure History & Physical: HPI:  Alexis Vaughan is a 66 y.o. female is here for an colonoscopy.   Past Medical History:  Diagnosis Date  . Acute renal failure (ARF) (HCC)   . Arthritis   . Bruises easily   . Colitis   . Depression   . Diabetes mellitus without complication (HCC)   . DVT (deep venous thrombosis) (HCC)   . Dyspnea   . H/O blood clots   . Heart murmur   . Hypercholesteremia   . Hypertension   . Poor circulation   . Swelling   . Ulcerative colitis [facility](HCC)     Past Surgical History:  Procedure Laterality Date  . amputaed toe    . ANKLE FRACTURE SURGERY    . BACK SURGERY    . BREAST BIOPSY Right    stereo/clip- neg  . EYE SURGERY    . LAMINECTOMY    . POSTERIOR FUSION LUMBAR SPINE    . removal hip posthesis    . TONSILLECTOMY    . TOTAL HIP ARTHROPLASTY    . TUBAL LIGATION      Prior to Admission medications   Medication Sig Start Date End Date Taking? Authorizing Provider  amLODipine (NORVASC) 5 MG tablet  11/26/15  Yes [provider]  Ascorbic Acid (VITAMIN C) 1000 MG tablet Take by mouth.   Yes [provider]  B Complex Vitamins (VITAMIN-B COMPLEX) TABS Take by mouth.   Yes [provider]  Calcium Carbonate-Vitamin D (CALCIUM-VITAMIN D) 500-200 MG-UNIT tablet Take by mouth.   Yes [provider]  Cholecalciferol (VITAMIN D3) 1000 units CAPS Take by mouth.   Yes [provider]  cloNIDine (CATAPRES) 0.1 MG tablet  10/14/15  Yes [provider]  cyanocobalamin (,VITAMIN B-12,) 1000 MCG/ML injection Inject into the muscle. 07/10/15  Yes [provider]  fentaNYL (DURAGESIC - DOSED MCG/HR) 50 MCG/HR Place 50 mcg onto the skin every 3 (three) days.   Yes [provider]  gabapentin (NEURONTIN) 300 MG capsule TAKE 2 CAPSULES BY MOUTH EVERY MORNING AND 3 CAPSULES EVERY EVENING 10/10/15  Yes  [provider]  Insulin Glargine (LANTUS SOLOSTAR) 100 UNIT/ML Solostar Pen Inject 65 units subcutaneously nightly 07/23/15  Yes [provider]  insulin lispro (HUMALOG) 100 UNIT/ML KiwkPen Inject 25 units before breakfast and lunch, and 40 units before dinner. 08/04/15  Yes [provider]  lisdexamfetamine (VYVANSE) 20 MG capsule Take by mouth.   Yes [provider]  lisinopril (PRINIVIL,ZESTRIL) 2.5 MG tablet  12/14/15  Yes [provider]  metoprolol succinate (TOPROL-XL) 50 MG 24 hr tablet TAKE 1 TABLET(50 MG) BY MOUTH EVERY DAY 10/23/15  Yes [provider]  Multiple Vitamin (MULTI-VITAMINS) TABS Take by mouth.   Yes [provider]  omeprazole (PRILOSEC) 40 MG capsule Take by mouth. 09/16/15  Yes [provider]  pravastatin (PRAVACHOL) 80 MG tablet Take by mouth. 09/16/15  Yes [provider]  traZODone (DESYREL) 100 MG tablet Take by mouth. 03/12/13  Yes [provider]  venlafaxine XR (EFFEXOR-XR) 150 MG 24 hr capsule Take by mouth. 03/12/13  Yes [provider]  levothyroxine (SYNTHROID, LEVOTHROID) 50 MCG tablet Take by mouth. 09/25/15 09/24/16  [provider]  SYRINGE-NEEDLE, DISP, 3 ML (B-D 3CC LUER-LOK SYR 25GX1") 25G X 1" 3 ML MISC use 1 as directed every month 07/23/15   [provider]  warfarin (COUMADIN) 1 MG tablet Take by mouth. 09/16/15   [provider]    Allergies as of 02/14/2017 - Review Complete 08/04/2016  Allergen Reaction Noted  . Hydrocodone Other (See Comments) 02/11/2016  . Morphine Other (See Comments) 02/11/2016  . Oxycodone Other (See Comments) 02/11/2016    Family History  Problem Relation Age of Onset  . Breast cancer Paternal Aunt 67  . Breast cancer Maternal Grandmother 110    Social History   Social History  . Marital status: Widowed    Spouse name: N/A  . Number of children: N/A  . Years of education: N/A   Occupational  History  . Not on file.   Social History Main Topics  . Smoking status: Never Smoker  . Smokeless tobacco: Never Used  . Alcohol use No  . Drug use: No  . Sexual activity: Not on file   Other Topics Concern  . Not on file   Social History Narrative  . No narrative on file    Review of Systems: See HPI, otherwise negative ROS  Physical Exam: BP (!) 152/65   Pulse 80   Temp 97.1 F (36.2 C) (Tympanic)   Resp 16   Ht 5\' 2"  (1.575 m)   Wt 94.3 kg (208 lb)   SpO2 96%   BMI 38.04 kg/m  General:   Alert,  pleasant and cooperative in NAD Head:  Normocephalic and atraumatic. Neck:  Supple; no masses or thyromegaly. Lungs:  Clear throughout to auscultation.    Heart:  Regular rate and rhythm. Abdomen:  Soft, nontender and nondistended. Normal bowel sounds, without guarding, and without rebound.   Neurologic:  Alert and  oriented x4;  grossly normal neurologically.  Impression/Plan: Alexis Vaughan is here for an colonoscopy to be performed for [facility] colon polyps and ulverative proctitis.  Risks, benefits, limitations, and alternatives regarding  colonoscopy have been reviewed with the patient.  Questions have been answered.  All parties agreeable.   Lynnae Prude, MD  04/25/2017, 7:26 AM

## 2017-04-25 NOTE — Anesthesia Postprocedure Evaluation (Signed)
Anesthesia Post Note  Patient: Starling MannsLinda C Acheampong  Procedure(s) Performed: Procedure(s) (LRB): COLONOSCOPY WITH PROPOFOL (N/A)  Patient location during evaluation: Endoscopy Anesthesia Type: General Level of consciousness: awake and alert Pain management: pain level controlled Vital Signs Assessment: post-procedure vital signs reviewed and stable Respiratory status: spontaneous breathing and respiratory function stable Cardiovascular status: stable Anesthetic complications: no     Last Vitals:  Vitals:   04/25/17 0813 04/25/17 0823  BP: 123/62 124/77  Pulse: 70 63  Resp: 16 (!) 21  Temp:      Last Pain:  Vitals:   04/25/17 0803  TempSrc: Tympanic                 Perline Awe K

## 2017-04-25 NOTE — Transfer of Care (Signed)
Immediate Anesthesia Transfer of Care Note  Patient: Alexis Vaughan  Procedure(s) Performed: Procedure(s): COLONOSCOPY WITH PROPOFOL (N/A)  Patient Location: PACU  Anesthesia Type:General  Level of Consciousness: sedated and responds to stimulation  Airway & Oxygen Therapy: Patient Spontanous Breathing and Patient connected to nasal cannula oxygen  Post-op Assessment: Report given to RN and Post -op Vital signs reviewed and stable  Post vital signs: Reviewed and stable  Last Vitals:  Vitals:   04/25/17 0803 04/25/17 0804  BP: (!) 118/49 (!) 118/49  Pulse: 67 67  Resp: 15 14  Temp: (!) 35.9 C     Last Pain:  Vitals:   04/25/17 0803  TempSrc: Tympanic         Complications: No apparent anesthesia complications

## 2017-04-25 NOTE — Anesthesia Post-op Follow-up Note (Cosign Needed)
Anesthesia QCDR form completed.        

## 2017-04-25 NOTE — Op Note (Signed)
[facility]enter[facility] Patient Name: Alexis Vaughan Procedure Date: 04/25/2017 7:34 AM MRN: [id number] Account #: [id number] Date of Birth: 1951-11-23 Admit Type: Outpatient Age: 66 Room: [facility] ENDO ROOM 1 Gender: Female Note Status: Finalized Procedure:            Colonoscopy Indications:          High risk colon cancer surveillance: Personal history                        of colonic polyps Providers:            Scot Jun, MD Referring MD:         Daniel Nones, MD (Referring MD) Medicines:            Propofol per Anesthesia Complications:        No immediate complications. Procedure:            Pre-Anesthesia Assessment:                       - After reviewing the risks and benefits, the patient                        was deemed in satisfactory condition to undergo the                        procedure.                       After obtaining informed consent, the colonoscope was                        passed under direct vision. Throughout the procedure,                        the patient's blood pressure, pulse, and oxygen                        saturations were monitored continuously. The                        Colonoscope was introduced through the anus and                        advanced to the the cecum, identified by appendiceal                        orifice and ileocecal valve. The colonoscopy was                        performed without difficulty. The patient tolerated the                        procedure well. The quality of the bowel preparation                        was good. Findings:      Multiple small-mouthed diverticula were found in the sigmoid colon and       descending colon.      Internal hemorrhoids were found during endoscopy. The hemorrhoids were       small and Grade I (internal hemorrhoids that do not prolapse).  The terminal ileum appeared normal.      The exam was otherwise without abnormality. Impression:           -  Diverticulosis in the sigmoid colon and in the                        descending colon.                       - Internal hemorrhoids.                       - The examined portion of the ileum was normal.                       - The examination was otherwise normal.                       - No specimens collected. Recommendation:       - Repeat colonoscopy in 5 years for surveillance. Scot Junobert T Elliott, MD 04/25/2017 8:04:19 AM This report has been signed electronically. Number of Addenda: 0 Note Initiated On: 04/25/2017 7:34 AM Scope Withdrawal Time: 0 hours 10 minutes 27 seconds  Total Procedure Duration: 0 hours 19 minutes 46 seconds       Libertas Green [facility]

## 2017-04-25 NOTE — Anesthesia Procedure Notes (Signed)
Performed by: Izen Petz Pre-anesthesia Checklist: Patient identified, Emergency Drugs available, Suction available, Patient being monitored and Timeout performed Patient Re-evaluated:Patient Re-evaluated prior to inductionOxygen Delivery Method: Nasal cannula Preoxygenation: Pre-oxygenation with 100% oxygen Intubation Type: IV induction       

## 2017-04-25 NOTE — Anesthesia Preprocedure Evaluation (Signed)
Anesthesia Evaluation  Patient identified by MRN, date of birth, ID band Patient awake    Reviewed: Allergy & Precautions, NPO status , Patient's Chart, lab work & pertinent test results  History of Anesthesia Complications Negative for: history of anesthetic complications  Airway Mallampati: III       Dental   Pulmonary neg pulmonary ROS,           Cardiovascular hypertension, Pt. on medications and Pt. on home beta blockers + Peripheral Vascular Disease  + Valvular Problems/Murmurs (murmur, no tx)      Neuro/Psych Depression negative neurological ROS     GI/Hepatic Neg liver ROS, PUD,   Endo/Other  diabetes, Type 2, Oral Hypoglycemic Agents  Renal/GU ARFRenal disease (hx, no problems since)     Musculoskeletal   Abdominal   Peds  Hematology negative hematology ROS (+)   Anesthesia Other Findings   Reproductive/Obstetrics                             Anesthesia Physical Anesthesia Plan  ASA: III  Anesthesia Plan: General   Post-op Pain Management:    Induction: Intravenous  Airway Management Planned: Nasal Cannula  Additional Equipment:   Intra-op Plan:   Post-operative Plan:   Informed Consent: I have reviewed the patients History and Physical, chart, labs and discussed the procedure including the risks, benefits and alternatives for the proposed anesthesia with the patient or authorized representative who has indicated his/her understanding and acceptance.     Plan Discussed with:   Anesthesia Plan Comments:         Anesthesia Quick Evaluation

## 2017-04-28 ENCOUNTER — Encounter: Payer: Self-pay | Admitting: Unknown Physician Specialty

## 2017-05-10 ENCOUNTER — Other Ambulatory Visit: Payer: Self-pay | Admitting: Internal Medicine

## 2017-05-10 DIAGNOSIS — Z1231 Encounter for screening mammogram for malignant neoplasm of breast: Secondary | ICD-10-CM

## 2017-06-06 ENCOUNTER — Ambulatory Visit: Admission: RE | Admit: 2017-06-06 | Payer: Medicare Other | Source: Ambulatory Visit

## 2017-06-14 ENCOUNTER — Other Ambulatory Visit: Payer: Self-pay | Admitting: Internal Medicine

## 2017-06-14 ENCOUNTER — Ambulatory Visit: Payer: Medicare Other | Attending: Orthopedic Surgery | Admitting: Physical Therapy

## 2017-06-14 ENCOUNTER — Ambulatory Visit
Admission: RE | Admit: 2017-06-14 | Discharge: 2017-06-14 | Disposition: A | Discharge status: Home / Self Care | Payer: Medicare Other | Source: Ambulatory Visit | Attending: Internal Medicine | Admitting: Internal Medicine

## 2017-06-14 ENCOUNTER — Encounter: Payer: Self-pay | Admitting: Physical Therapy

## 2017-06-14 DIAGNOSIS — Z1231 Encounter for screening mammogram for malignant neoplasm of breast: Secondary | ICD-10-CM | POA: Insufficient documentation

## 2017-06-14 DIAGNOSIS — G8929 Other chronic pain: Secondary | ICD-10-CM | POA: Insufficient documentation

## 2017-06-14 DIAGNOSIS — R2681 Unsteadiness on feet: Secondary | ICD-10-CM | POA: Diagnosis present

## 2017-06-14 DIAGNOSIS — R262 Difficulty in walking, not elsewhere classified: Secondary | ICD-10-CM | POA: Diagnosis not present

## 2017-06-14 DIAGNOSIS — M545 Low back pain, unspecified: Secondary | ICD-10-CM

## 2017-06-14 DIAGNOSIS — M25551 Pain in right hip: Secondary | ICD-10-CM | POA: Diagnosis present

## 2017-06-14 DIAGNOSIS — M25552 Pain in left hip: Secondary | ICD-10-CM | POA: Diagnosis present

## 2017-06-14 DIAGNOSIS — M6281 Muscle weakness (generalized): Secondary | ICD-10-CM | POA: Diagnosis present

## 2017-06-14 NOTE — Therapy (Signed)
[facility] [address] [place], [place], [address] Phone: [phone]   Fax:  [phone]  Physical Therapy Evaluation  Patient Details  Name: Alexis Vaughan MRN: [id number] Date of Birth: 04-09-1951 Referring Provider: Altamese Cabal, PA-C  Encounter Date: 06/14/2017      PT End of Session - 06/14/17 1645    Visit Number 1   Number of Visits 8   Date for PT Re-Evaluation 07/12/17   Authorization - Visit Number 1   Authorization - Number of Visits 10   PT Start Time 1348   PT Stop Time 1437   PT Time Calculation (min) 49 min   Equipment Utilized During Treatment Gait belt   Activity Tolerance Patient tolerated treatment well;Patient limited by fatigue;Patient limited by pain   Behavior During Therapy [facility] for tasks assessed/performed      Past Medical History:  Diagnosis Date  . Acute renal failure (ARF) (HCC)   . Arthritis   . Bruises easily   . Colitis   . Depression   . Diabetes mellitus without complication (HCC)   . DVT (deep venous thrombosis) (HCC)   . Dyspnea   . H/O blood clots   . Heart murmur   . Hypercholesteremia   . Hypertension   . Poor circulation   . Swelling   . Ulcerative colitis [facility])     Past Surgical History:  Procedure Laterality Date  . amputaed toe    . ANKLE FRACTURE SURGERY    . BACK SURGERY    . BREAST BIOPSY Right    stereo/clip- neg  . COLONOSCOPY WITH PROPOFOL N/A 04/25/2017   Procedure: COLONOSCOPY WITH PROPOFOL;  Surgeon: Scot Jun, MD;  Location: [facility] ENDOSCOPY;  Service: Endoscopy;  Laterality: N/A;  . EYE SURGERY    . LAMINECTOMY    . POSTERIOR FUSION LUMBAR SPINE    . removal hip posthesis    . TONSILLECTOMY    . TOTAL HIP ARTHROPLASTY    . TUBAL LIGATION      There were no vitals filed for this visit.       Subjective Assessment - 06/14/17 1632    Subjective Pt. age 66 reports to PT with LBP and bilateral hip pain. Pt. states her hips bother her the most  and she believes that is where her back pain is coming from. She states her L hip bothers her more than her R and she recently received a corticosteroid injection into her L greater trochanteric bursa with good results. Pt. states sleeping is difficult on the L hip due to the bursitis. Pt. states falling is her biggest fear and she wants to work on her balance.   Pertinent History Pt. had a R hip replacement approx 5 years ago and states she was doing well until she woke up one morning "delirious" secondary to the THA becoming infected. Pt. was hospitalized and eventually received a new THA approx 6 weeks later. Both hips cause her pain. She also has a hx of spinal stenosis, but believes most of her back pain is secondary to her hip pain.    Limitations Lifting;Standing;Walking;House hold activities   How long can you sit comfortably? unlimited   How long can you stand comfortably? limited   How long can you walk comfortably? very limited secondary to pain   Diagnostic tests MRI   Patient Stated Goals pt. would like to ambulate safely and learn how to prevent falls   Currently in Pain? Yes  Pain Score 1    Pain Location Back   Pain Orientation Lower   Pain Descriptors / Indicators Aching   Pain Type Chronic pain   Pain Onset More than a month ago   Pain Frequency Constant   Aggravating Factors  standing and walking   Pain Relieving Factors sitting and reclining   Multiple Pain Sites Yes   Pain Location Hip            [facility] PT Assessment - 06/14/17 0001      Assessment   Medical Diagnosis Low back pain   Referring Provider Altamese CabalMaurice Jones, PA-C   Onset Date/Surgical Date 12/06/16   Prior Therapy yes, see previous notes     Precautions   Precautions None     Balance Screen   Has the patient fallen in the past 6 months No   Has the patient had a decrease in activity level because of a fear of falling?  No      Discussed HEP/  Gait pattern.  No charge for there.ex. Today.             PT Education - 06/14/17 1644    Education provided Yes   Education Details HEP of 3 way hip; pt educated on safety during HEP    Person(s) Educated Patient   Methods Explanation;Demonstration   Comprehension Verbalized understanding;Returned demonstration             PT Long Term Goals - 06/14/17 1710      PT LONG TERM GOAL #1   Title Pt. will report decrease in worst LBP and hip pain to <5/10 in order to improve ability to participate in desired activities   Baseline 7/10 on 06/14/17   Time 4   Period Weeks   Status New     PT LONG TERM GOAL #2   Title Pt. will increase LEFS to >40 out of 80 to improve independence with functional mobility.     Baseline LEFS: 30 out of 80 06/14/17   Time 4   Period Weeks   Status New     PT LONG TERM GOAL #3   Title Pt. will increase Berg balance test to >50/56 to decrease fall risk/ improve independence with gait.     Baseline 45/56 on 06/14/17   Time 4   Period Weeks   Status New     PT LONG TERM GOAL #4   Title Pt. will improve gross BLE MMT t0 4+/5 in order to improve independence with gait and functional activities.    Baseline 3+/5 hip flexors, 4/5 hip abd/add and knee flex/ext 06/14/17   Time 4   Period Weeks   Status New                Plan - 06/14/17 1646    Clinical Impression Statement Pt. age 66 reports to PT with chronic LBP and bilateral hip pain with L>R. Pt. ambulates with single point cane in R hand and demonstrates antalgic gait with right lateral truck lean, decreased step height, decreased heel strike, and decrease gait speed. Pt. reports no tenderness to palpation of thoracic or lumbar vertebrae or paraspinals in R sidelying, but does report tenderness to palpation of L greater trochanter. Pt. has minor leg length discrepancy with R leg 88cm and L leg 87cm. Pt demonstrates 3+/5 B hip flexion MMT, and 4/5 hip abduction and adduction, and knee flexion and extension. Pt. reports decreased function as  made evident by an LEFS score of  30/80. Pt. demonstrates decreased static and dynamic standing balance with Berg Balance test score 45/56. Pt. will benefit from skilled PT in order to decrease LBP and hip, as well as improve dynamic standing balance in order to increase ability to participate in desired activities.   Clinical Presentation Stable   Clinical Decision Making Moderate   Rehab Potential Good   PT Frequency 2x / week   PT Duration 4 weeks   PT Treatment/Interventions ADLs/Self Care Home Management;Cryotherapy;Patient/family education;Cognitive remediation;Neuromuscular re-education;Balance training;Therapeutic exercise;Therapeutic activities;Functional mobility training;Stair training;Gait training;Manual techniques;Passive range of motion;Moist Heat;Orthotic Fit/Training   PT Next Visit Plan Issue HEP/ progress strengthening and independence with gait   PT Home Exercise Plan See HEP   Consulted and Agree with Plan of Care Patient      Patient will benefit from skilled therapeutic intervention in order to improve the following deficits and impairments:  Abnormal gait, Decreased strength, Improper body mechanics, Postural dysfunction, Difficulty walking, Decreased knowledge of use of DME, Decreased mobility, Decreased activity tolerance, Impaired flexibility, Decreased range of motion, Decreased balance, Decreased endurance, Hypomobility, Increased fascial restricitons, Pain, Decreased safety awareness  Visit Diagnosis: Difficulty in walking, not elsewhere classified  Muscle weakness (generalized)  Chronic bilateral low back pain without sciatica  Pain in right hip  Pain in left hip      G-Codes - 07-08-2017 1726    Functional Assessment Tool Used (Outpatient Only) Clinical impression/ LEFS/ gait difficulty/ muscle weakness/ Berg   Functional Limitation Mobility: Walking and moving around   Mobility: Walking and Moving Around Current Status [phone]) At least 20 percent but less  than 40 percent impaired, limited or restricted   Mobility: Walking and Moving Around Goal Status [phone]) At least 1 percent but less than 20 percent impaired, limited or restricted       Problem List Patient Active Problem List   Diagnosis Date Noted  . 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 Acute osteomyelitis of ankle or foot (HCC) 03/09/2016  . Recurrent major depressive disorder, in full remission (HCC) 03/09/2016  . Arthritis 02/11/2016  . Chronic pain 02/11/2016  . Clinical depression 02/11/2016  . Breathlessness on exertion 02/11/2016  . Ulcerative colitis (HCC) 02/11/2016  . H/O deep venous thrombosis 12/30/2015  . Encounter for surgical follow-up care 05/15/2015  . History of artificial joint 05/15/2015  . Type 2 diabetes mellitus (HCC) 05/12/2015  . History of operative procedure on hip 05/13/2014  . Fracture of humerus neck 04/18/2014  . Phlebitis of deep vein of lower extremity (HCC) 04/04/2014  . Hypercholesterolemia 04/11/2013  . BP (high blood pressure) 04/11/2013  . Infection and inflammatory reaction due to internal prosthetic device, implant, and graft 03/20/2013  . Acquired spondylolisthesis 10/25/2012  . Degeneration of intervertebral disc of lumbosacral region 07/11/2012  . Lumbar canal stenosis 06/20/2012  . Calf muscle weakness 05/31/2012  . Difficulty in walking 05/31/2012  . Thoracic and lumbosacral neuritis 05/31/2012  . Coxitis 04/28/2012   Cammie Mcgee, PT, DPT # [id number] 2017-07-08, 5:27 PM  [facility] [address] [place], [place], [address] Phone: [phone]   Fax:  [phone]  Name: LYNDSI ALTIC MRN: [id number] Date of Birth: 1951-03-09

## 2017-06-16 ENCOUNTER — Ambulatory Visit: Payer: Medicare Other | Admitting: Physical Therapy

## 2017-06-16 ENCOUNTER — Encounter: Payer: Self-pay | Admitting: Physical Therapy

## 2017-06-16 DIAGNOSIS — R262 Difficulty in walking, not elsewhere classified: Secondary | ICD-10-CM | POA: Diagnosis not present

## 2017-06-16 DIAGNOSIS — R2681 Unsteadiness on feet: Secondary | ICD-10-CM

## 2017-06-16 DIAGNOSIS — M25552 Pain in left hip: Secondary | ICD-10-CM

## 2017-06-16 DIAGNOSIS — M545 Low back pain: Secondary | ICD-10-CM

## 2017-06-16 DIAGNOSIS — M25551 Pain in right hip: Secondary | ICD-10-CM

## 2017-06-16 DIAGNOSIS — G8929 Other chronic pain: Secondary | ICD-10-CM

## 2017-06-16 DIAGNOSIS — M6281 Muscle weakness (generalized): Secondary | ICD-10-CM

## 2017-06-16 NOTE — Therapy (Signed)
[facility] [address]. [place], [place], [address] Phone: [phone]   Fax:  [phone]  Physical Therapy Treatment  Patient Details  Name: Alexis Vaughan MRN: [id number] Date of Birth: 08/02/1951 Referring Provider: Altamese Cabal, PA-C  Encounter Date: 06/16/2017      PT End of Session - 06/16/17 1503    Visit Number 2   Number of Visits 8   Date for PT Re-Evaluation 07/12/17   Authorization - Visit Number 2   Authorization - Number of Visits 10   PT Start Time 1356   PT Stop Time 1454   PT Time Calculation (min) 58 min   Equipment Utilized During Treatment Gait belt   Activity Tolerance Patient tolerated treatment well;No increased pain   Behavior During Therapy WFL for tasks assessed/performed      Past Medical History:  Diagnosis Date  . Acute renal failure (ARF) (HCC)   . Arthritis   . Bruises easily   . Colitis   . Depression   . Diabetes mellitus without complication (HCC)   . DVT (deep venous thrombosis) (HCC)   . Dyspnea   . H/O blood clots   . Heart murmur   . Hypercholesteremia   . Hypertension   . Poor circulation   . Swelling   . Ulcerative colitis [facility])     Past Surgical History:  Procedure Laterality Date  . amputaed toe    . ANKLE FRACTURE SURGERY    . BACK SURGERY    . BREAST BIOPSY Right    stereo/clip- neg  . COLONOSCOPY WITH PROPOFOL N/A 04/25/2017   Procedure: COLONOSCOPY WITH PROPOFOL;  Surgeon: Scot Jun, MD;  Location: [facility] ENDOSCOPY;  Service: Endoscopy;  Laterality: N/A;  . EYE SURGERY    . LAMINECTOMY    . POSTERIOR FUSION LUMBAR SPINE    . removal hip posthesis    . TONSILLECTOMY    . TOTAL HIP ARTHROPLASTY    . TUBAL LIGATION      There were no vitals filed for this visit.      Subjective Assessment - 06/16/17 1506    Subjective Pt. reports 0/10 pain and denies recent falls. Pt. states she had a compound L ankle fracture approximately 5 years ago and it has  been swollen for the past couple days, but she states it is not painful.    Pertinent History Pt. had a R hip replacement approx 5 years ago and states she was doing well until she woke up one morning "delirious" secondary to the THA becoming infected. Pt. was hospitalized and eventually received a new THA approx 6 weeks later. Both hips cause her pain. She also has a hx of spinal stenosis, but believes most of her back pain is secondary to her hip pain.    Limitations Lifting;Standing;Walking;House hold activities   How long can you sit comfortably? unlimited   How long can you stand comfortably? limited   How long can you walk comfortably? very limited secondary to pain   Diagnostic tests MRI   Patient Stated Goals pt. would like to ambulate safely and learn how to prevent falls   Currently in Pain? No/denies   Pain Score 0-No pain   Multiple Pain Sites No     Treatment: STM to L greater trochanter bursa and IT band x29min L hip distraction 5x30s L passive hamstring stretch x3  TherEx: 3-way hip 2x15 Sideways walking without resistance x6 laps Resisted walking forward, backward, L and  R x5 each  Gait training: Ambulate in clinic/ outside with instruction on proper foot placement/ SPC use on L and posture.         PT Education - 06/16/17 1501    Education provided Yes   Education Details cane placement in L hand   Person(s) Educated Patient   Methods Explanation;Verbal cues   Comprehension Verbalized understanding;Returned demonstration             PT Long Term Goals - 06/14/17 1710      PT LONG TERM GOAL #1   Title Pt. will report decrease in worst LBP and hip pain to <5/10 in order to improve ability to participate in desired activities   Baseline 7/10 on 06/14/17   Time 4   Period Weeks   Status New     PT LONG TERM GOAL #2   Title Pt. will increase LEFS to >40 out of 80 to improve independence with functional mobility.     Baseline LEFS: 30 out of 80 06/14/17    Time 4   Period Weeks   Status New     PT LONG TERM GOAL #3   Title Pt. will increase Berg balance test to >50/56 to decrease fall risk/ improve independence with gait.     Baseline 45/56 on 06/14/17   Time 4   Period Weeks   Status New     PT LONG TERM GOAL #4   Title Pt. will improve gross BLE MMT t0 4+/5 in order to improve independence with gait and functional activities.    Baseline 3+/5 hip flexors, 4/5 hip abd/add and knee flex/ext 06/14/17   Time 4   Period Weeks   Status New             Plan - 06/16/17 1508    Clinical Impression Statement Pt. demonstrates decreased pain in low back and hips since the previous session. Once pt. moved her cane to her L hand instead of R, her gait mechanics immediately improved. Pt. was instructed in proper gait mechanics and reported no pain during ambulation. Pt tolerated STM to L greater trochanter bursa with tenderness to palpation noted. Pt will continue to benefit from skilled PT in order to improve function and enable her to return to desired activities.   Clinical Presentation Stable   Clinical Decision Making Moderate   Rehab Potential Good   PT Frequency 2x / week   PT Duration 4 weeks   PT Treatment/Interventions ADLs/Self Care Home Management;Cryotherapy;Patient/family education;Cognitive remediation;Neuromuscular re-education;Balance training;Therapeutic exercise;Therapeutic activities;Functional mobility training;Stair training;Gait training;Manual techniques;Passive range of motion;Moist Heat;Orthotic Fit/Training   PT Next Visit Plan Issue HEP/ progress strengthening and independence with gait   PT Home Exercise Plan See HEP   Consulted and Agree with Plan of Care Patient      Patient will benefit from skilled therapeutic intervention in order to improve the following deficits and impairments:  Abnormal gait, Decreased strength, Improper body mechanics, Postural dysfunction, Difficulty walking, Decreased knowledge of use  of DME, Decreased mobility, Decreased activity tolerance, Impaired flexibility, Decreased range of motion, Decreased balance, Decreased endurance, Hypomobility, Increased fascial restricitons, Pain, Decreased safety awareness  Visit Diagnosis: Difficulty in walking, not elsewhere classified  Muscle weakness (generalized)  Chronic bilateral low back pain without sciatica  Pain in right hip  Pain in left hip  Unsteady gait     Problem List Patient Active Problem List   Diagnosis Date Noted  . Ulcer of toe due to type 2 diabetes mellitus (  HCC) 03/30/2016  . Acute osteomyelitis of ankle or foot (HCC) 03/09/2016  . Recurrent major depressive disorder, in full remission (HCC) 03/09/2016  . Arthritis 02/11/2016  . Chronic pain 02/11/2016  . Clinical depression 02/11/2016  . Breathlessness on exertion 02/11/2016  . Ulcerative colitis (HCC) 02/11/2016  . H/O deep venous thrombosis 12/30/2015  . Encounter for surgical follow-up care 05/15/2015  . History of artificial joint 05/15/2015  . Type 2 diabetes mellitus (HCC) 05/12/2015  . History of operative procedure on hip 05/13/2014  . Fracture of humerus neck 04/18/2014  . Phlebitis of deep vein of lower extremity (HCC) 04/04/2014  . Hypercholesterolemia 04/11/2013  . BP (high blood pressure) 04/11/2013  . Infection and inflammatory reaction due to internal prosthetic device, implant, and graft 03/20/2013  . Acquired spondylolisthesis 10/25/2012  . Degeneration of intervertebral disc of lumbosacral region 07/11/2012  . Lumbar canal stenosis 06/20/2012  . Calf muscle weakness 05/31/2012  . Difficulty in walking 05/31/2012  . Thoracic and lumbosacral neuritis 05/31/2012  . 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 [place], [place], [address] Phone: [phone]   Fax:  [phone]  Name: Alexis Vaughan MRN: [id number] Date of Birth: 11-Mar-1951

## 2017-06-21 ENCOUNTER — Ambulatory Visit: Payer: Medicare Other | Admitting: Physical Therapy

## 2017-06-21 ENCOUNTER — Encounter: Payer: Self-pay | Admitting: Physical Therapy

## 2017-06-21 DIAGNOSIS — M545 Low back pain, unspecified: Secondary | ICD-10-CM

## 2017-06-21 DIAGNOSIS — M25552 Pain in left hip: Secondary | ICD-10-CM

## 2017-06-21 DIAGNOSIS — G8929 Other chronic pain: Secondary | ICD-10-CM

## 2017-06-21 DIAGNOSIS — M6281 Muscle weakness (generalized): Secondary | ICD-10-CM

## 2017-06-21 DIAGNOSIS — R262 Difficulty in walking, not elsewhere classified: Secondary | ICD-10-CM

## 2017-06-21 DIAGNOSIS — R2681 Unsteadiness on feet: Secondary | ICD-10-CM

## 2017-06-21 NOTE — Therapy (Signed)
[place] [facility] [address] [place], [place], [address] Phone: [phone]   Fax:  [phone]  Physical Therapy Treatment  Patient Details  Name: Alexis Vaughan MRN: [id number] Date of Birth: Dec 04, 1951 Referring Provider: Altamese Cabal, PA-C  Encounter Date: 06/21/2017      PT End of Session - 06/21/17 1654    Visit Number 3   Number of Visits 8   Date for PT Re-Evaluation 07/12/17   Authorization - Visit Number 3   Authorization - Number of Visits 10   PT Start Time 1540   PT Stop Time 1629   PT Time Calculation (min) 49 min   Equipment Utilized During Treatment Gait belt   Activity Tolerance Patient tolerated treatment well;No increased pain   Behavior During Therapy WFL for tasks assessed/performed      Past Medical History:  Diagnosis Date  . Acute renal failure (ARF) (HCC)   . Arthritis   . Bruises easily   . Colitis   . Depression   . Diabetes mellitus without complication (HCC)   . DVT (deep venous thrombosis) (HCC)   . Dyspnea   . H/O blood clots   . Heart murmur   . Hypercholesteremia   . Hypertension   . Poor circulation   . Swelling   . Ulcerative colitis [facility])     Past Surgical History:  Procedure Laterality Date  . amputaed toe    . ANKLE FRACTURE SURGERY    . BACK SURGERY    . BREAST BIOPSY Right    stereo/clip- neg  . COLONOSCOPY WITH PROPOFOL N/A 04/25/2017   Procedure: COLONOSCOPY WITH PROPOFOL;  Surgeon: Scot Jun, MD;  Location: [facility] ENDOSCOPY;  Service: Endoscopy;  Laterality: N/A;  . EYE SURGERY    . LAMINECTOMY    . POSTERIOR FUSION LUMBAR SPINE    . removal hip posthesis    . TONSILLECTOMY    . TOTAL HIP ARTHROPLASTY    . TUBAL LIGATION      There were no vitals filed for this visit.      Subjective Assessment - 06/21/17 1652    Subjective Pt. reports 0/10 pain in low back and 1/10 pain in hip, and denies recent falls. Pt. states she has been ambulating with SPC in L  hand, and she can tell a difference in her gait mechanics. Pt. reports compliance with HEP.   Pertinent History Pt. had a R hip replacement approx 5 years ago and states she was doing well until she woke up one morning "delirious" secondary to the THA becoming infected. Pt. was hospitalized and eventually received a new THA approx 6 weeks later. Both hips cause her pain. She also has a hx of spinal stenosis, but believes most of her back pain is secondary to her hip pain.    Limitations Lifting;Standing;Walking;House hold activities   How long can you sit comfortably? unlimited   How long can you stand comfortably? limited   How long can you walk comfortably? very limited secondary to pain   Diagnostic tests MRI   Patient Stated Goals pt. would like to ambulate safely and learn how to prevent falls   Currently in Pain? No/denies   Pain Score 1    Pain Location Hip   Pain Orientation Left   Pain Descriptors / Indicators Aching   Pain Type Chronic pain   Pain Onset More than a month ago   Pain Frequency Intermittent   Multiple Pain Sites No  Treatment: STM to L greater trochanter and L lateral thigh (IT band) x4112min Hip distraction mobilization 5x30s each Passive Obers stretch with STM to greater trochanter and L lateral thigh x712min Passive modified Thomas test x30s  TherEx: SciFit elliptical x6410min L4 Sidelying clamshells x10. Pt educated on proper positioning for optimal glut med activation Sidelying reverse clamshells x10 Hooklying bridges x10 Squats in // bars x15 3 way hip in // bars x15 each Discussed HEP        PT Education - 06/21/17 1653    Education provided Yes   Education Details see HEP   Person(s) Educated Patient   Methods Explanation;Demonstration;Handout   Comprehension Verbalized understanding;Returned demonstration;Verbal cues required             PT Long Term Goals - 06/14/17 1710      PT LONG TERM GOAL #1   Title Pt. will report decrease  in worst LBP and hip pain to <5/10 in order to improve ability to participate in desired activities   Baseline 7/10 on 06/14/17   Time 4   Period Weeks   Status New     PT LONG TERM GOAL #2   Title Pt. will increase LEFS to >40 out of 80 to improve independence with functional mobility.     Baseline LEFS: 30 out of 80 06/14/17   Time 4   Period Weeks   Status New     PT LONG TERM GOAL #3   Title Pt. will increase Berg balance test to >50/56 to decrease fall risk/ improve independence with gait.     Baseline 45/56 on 06/14/17   Time 4   Period Weeks   Status New     PT LONG TERM GOAL #4   Title Pt. will improve gross BLE MMT t0 4+/5 in order to improve independence with gait and functional activities.    Baseline 3+/5 hip flexors, 4/5 hip abd/add and knee flex/ext 06/14/17   Time 4   Period Weeks   Status New               Plan - 06/21/17 1656    Clinical Impression Statement Pt. demonstrates decreased LBP and hip pain, but still demonstrates moderate tenderness to palpation over L greater trochanter. Pt. responded well to STM over L greater trochanter and reports decrease in tenderness over bursa. Pt. tolerated progression of exercises with no increase in pain. Pt's gait mechanics have improved with SPC in L hand and pt. states that though it still feels awkward it has helped.    Clinical Presentation Stable   Clinical Decision Making Moderate   Rehab Potential Good   PT Frequency 2x / week   PT Duration 4 weeks   PT Treatment/Interventions ADLs/Self Care Home Management;Cryotherapy;Patient/family education;Cognitive remediation;Neuromuscular re-education;Balance training;Therapeutic exercise;Therapeutic activities;Functional mobility training;Stair training;Gait training;Manual techniques;Passive range of motion;Moist Heat;Orthotic Fit/Training   PT Next Visit Plan Issue HEP/ progress strengthening and independence with gait   PT Home Exercise Plan See HEP   Consulted and  Agree with Plan of Care Patient      Patient will benefit from skilled therapeutic intervention in order to improve the following deficits and impairments:  Abnormal gait, Decreased strength, Improper body mechanics, Postural dysfunction, Difficulty walking, Decreased knowledge of use of DME, Decreased mobility, Decreased activity tolerance, Impaired flexibility, Decreased range of motion, Decreased balance, Decreased endurance, Hypomobility, Increased fascial restricitons, Pain, Decreased safety awareness  Visit Diagnosis: Difficulty in walking, not elsewhere classified  Muscle weakness (generalized)  Chronic bilateral  low back pain without sciatica  Pain in left hip  Unsteady gait     Problem List Patient Active Problem List   Diagnosis Date Noted  . 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 Acute osteomyelitis of ankle or foot (HCC) 03/09/2016  . Recurrent major depressive disorder, in full remission (HCC) 03/09/2016  . Arthritis 02/11/2016  . Chronic pain 02/11/2016  . Clinical depression 02/11/2016  . Breathlessness on exertion 02/11/2016  . Ulcerative colitis (HCC) 02/11/2016  . H/O deep venous thrombosis 12/30/2015  . Encounter for surgical follow-up care 05/15/2015  . History of artificial joint 05/15/2015  . Type 2 diabetes mellitus (HCC) 05/12/2015  . History of operative procedure on hip 05/13/2014  . Fracture of humerus neck 04/18/2014  . Phlebitis of deep vein of lower extremity (HCC) 04/04/2014  . Hypercholesterolemia 04/11/2013  . BP (high blood pressure) 04/11/2013  . Infection and inflammatory reaction due to internal prosthetic device, implant, and graft 03/20/2013  . Acquired spondylolisthesis 10/25/2012  . Degeneration of intervertebral disc of lumbosacral region 07/11/2012  . Lumbar canal stenosis 06/20/2012  . Calf muscle weakness 05/31/2012  . Difficulty in walking 05/31/2012  . Thoracic and lumbosacral neuritis 05/31/2012   . 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 [place], [place], [address] Phone: [phone]   Fax:  [phone]  Name: Alexis Vaughan MRN: [id number] Date of Birth: [birth year]

## 2017-06-23 ENCOUNTER — Encounter: Payer: Self-pay | Admitting: Physical Therapy

## 2017-06-23 ENCOUNTER — Ambulatory Visit: Payer: Medicare Other | Admitting: Physical Therapy

## 2017-06-23 DIAGNOSIS — M6281 Muscle weakness (generalized): Secondary | ICD-10-CM

## 2017-06-23 DIAGNOSIS — M545 Low back pain: Secondary | ICD-10-CM

## 2017-06-23 DIAGNOSIS — G8929 Other chronic pain: Secondary | ICD-10-CM

## 2017-06-23 DIAGNOSIS — M25552 Pain in left hip: Secondary | ICD-10-CM

## 2017-06-23 DIAGNOSIS — R262 Difficulty in walking, not elsewhere classified: Secondary | ICD-10-CM

## 2017-06-23 DIAGNOSIS — R2681 Unsteadiness on feet: Secondary | ICD-10-CM

## 2017-06-23 NOTE — Therapy (Signed)
[place] [facility] [address] [place], [place], [address] Phone: [phone]   Fax:  [phone]  Physical Therapy Treatment  Patient Details  Name: Alexis Vaughan MRN: [id number] Date of Birth: 12/05/1951 Referring Provider: Altamese Cabal, PA-C  Encounter Date: 06/23/2017      PT End of Session - 06/23/17 1723    Visit Number 4   Number of Visits 8   Date for PT Re-Evaluation 07/12/17   Authorization - Visit Number 4   Authorization - Number of Visits 10   PT Start Time 1510   PT Stop Time 1558   PT Time Calculation (min) 48 min   Equipment Utilized During Treatment Gait belt   Activity Tolerance Patient tolerated treatment well;No increased pain   Behavior During Therapy WFL for tasks assessed/performed      Past Medical History:  Diagnosis Date  . Acute renal failure (ARF) (HCC)   . Arthritis   . Bruises easily   . Colitis   . Depression   . Diabetes mellitus without complication (HCC)   . DVT (deep venous thrombosis) (HCC)   . Dyspnea   . H/O blood clots   . Heart murmur   . Hypercholesteremia   . Hypertension   . Poor circulation   . Swelling   . Ulcerative colitis [facility])     Past Surgical History:  Procedure Laterality Date  . amputaed toe    . ANKLE FRACTURE SURGERY    . BACK SURGERY    . BREAST BIOPSY Right    stereo/clip- neg  . COLONOSCOPY WITH PROPOFOL N/A 04/25/2017   Procedure: COLONOSCOPY WITH PROPOFOL;  Surgeon: Scot Jun, MD;  Location: [facility] ENDOSCOPY;  Service: Endoscopy;  Laterality: N/A;  . EYE SURGERY    . LAMINECTOMY    . POSTERIOR FUSION LUMBAR SPINE    . removal hip posthesis    . TONSILLECTOMY    . TOTAL HIP ARTHROPLASTY    . TUBAL LIGATION      There were no vitals filed for this visit.      Subjective Assessment - 06/23/17 1719    Subjective Pt reports 0/10 pain in her low back and hip. She denies recent falls, and states she is feeling better with her SPC in her L hand.  Pt. reports fatigue today due to helping friend pack for a move.    Pertinent History Pt. had a R hip replacement approx 5 years ago and states she was doing well until she woke up one morning "delirious" secondary to the THA becoming infected. Pt. was hospitalized and eventually received a new THA approx 6 weeks later. Both hips cause her pain. She also has a hx of spinal stenosis, but believes most of her back pain is secondary to her hip pain.    Limitations Lifting;Standing;Walking;House hold activities   How long can you sit comfortably? unlimited   How long can you stand comfortably? limited   How long can you walk comfortably? very limited secondary to pain   Diagnostic tests MRI   Patient Stated Goals pt. would like to ambulate safely and learn how to prevent falls   Currently in Pain? No/denies   Pain Score 0-No pain   Multiple Pain Sites No      Treatment: R sidelying STM to L greater trochanter with pillow between knees. Tenderness to palpation over bursa, negative for tenderness over IT band or L lateral knee. Pt. Is responding well to [facility] and reports  decreased tenderness Hip distraction mobilization grade III 3x30s. Pt responded well and reported "this feels really good"    TherEx: NuStep L7 x72min (not billed) Walking in hallway with SPC in L hand 554ft. Pt required seated rest break due to fatigue Walking in hallway with [facility] with head turns to find numbers/letters on walls x324ft Sidelying reverse clamshells x15 Sidelying clamshells x15. Pt required tactile cueing of glut med muscle for correct activation Squats in // bars x15 Resisted walking in // bars with BTB x5 forward and backward; x3 each side       PT Education - 06/23/17 1723    Education provided Yes   Education Details continuance of HEP   Person(s) Educated Patient   Methods Explanation   Comprehension Verbalized understanding             PT Long Term Goals - 06/14/17 1710      PT LONG TERM  GOAL #1   Title Pt. will report decrease in worst LBP and hip pain to <5/10 in order to improve ability to participate in desired activities   Baseline 7/10 on 06/14/17   Time 4   Period Weeks   Status New     PT LONG TERM GOAL #2   Title Pt. will increase LEFS to >40 out of 80 to improve independence with functional mobility.     Baseline LEFS: 30 out of 80 06/14/17   Time 4   Period Weeks   Status New     PT LONG TERM GOAL #3   Title Pt. will increase Berg balance test to >50/56 to decrease fall risk/ improve independence with gait.     Baseline 45/56 on 06/14/17   Time 4   Period Weeks   Status New     PT LONG TERM GOAL #4   Title Pt. will improve gross BLE MMT t0 4+/5 in order to improve independence with gait and functional activities.    Baseline 3+/5 hip flexors, 4/5 hip abd/add and knee flex/ext 06/14/17   Time 4   Period Weeks   Status New               Plan - 06/23/17 1725    Clinical Impression Statement Pt. demonstrates decreased LBP and hip pain, as well as decrease in tenderness to palpation over L greater trochanter. Pt. was able to ambulate in hall with [facility] in L hand for approx 572ft, but required seated rest break before continuing. Pt. also demonstrated ability to ambulate with head turns and finding letter/numbers on the wall. Pt. demonstrated minor loss of balance but recovered without PT assist. Pt. will continue to benefit from skilled PT in order to decrease LBP and hip pain, as well as decrease risk for falls.    Clinical Presentation Stable   Clinical Decision Making Moderate   Rehab Potential Good   PT Frequency 2x / week   PT Duration 4 weeks   PT Treatment/Interventions ADLs/Self Care Home Management;Cryotherapy;Patient/family education;Cognitive remediation;Neuromuscular re-education;Balance training;Therapeutic exercise;Therapeutic activities;Functional mobility training;Stair training;Gait training;Manual techniques;Passive range of motion;Moist  Heat;Orthotic Fit/Training   PT Next Visit Plan Issue HEP/ progress strengthening and independence with gait   PT Home Exercise Plan See HEP   Consulted and Agree with Plan of Care Patient      Patient will benefit from skilled therapeutic intervention in order to improve the following deficits and impairments:  Abnormal gait, Decreased strength, Improper body mechanics, Postural dysfunction, Difficulty walking, Decreased knowledge of use of DME, Decreased  mobility, Decreased activity tolerance, Impaired flexibility, Decreased range of motion, Decreased balance, Decreased endurance, Hypomobility, Increased fascial restricitons, Pain, Decreased safety awareness  Visit Diagnosis: Difficulty in walking, not elsewhere classified  Muscle weakness (generalized)  Chronic bilateral low back pain without sciatica  Pain in left hip  Unsteady gait     Problem List Patient Active Problem List   Diagnosis Date Noted  . 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 Acute osteomyelitis of ankle or foot (HCC) 03/09/2016  . Recurrent major depressive disorder, in full remission (HCC) 03/09/2016  . Arthritis 02/11/2016  . Chronic pain 02/11/2016  . Clinical depression 02/11/2016  . Breathlessness on exertion 02/11/2016  . Ulcerative colitis (HCC) 02/11/2016  . H/O deep venous thrombosis 12/30/2015  . Encounter for surgical follow-up care 05/15/2015  . History of artificial joint 05/15/2015  . Type 2 diabetes mellitus (HCC) 05/12/2015  . History of operative procedure on hip 05/13/2014  . Fracture of humerus neck 04/18/2014  . Phlebitis of deep vein of lower extremity (HCC) 04/04/2014  . Hypercholesterolemia 04/11/2013  . BP (high blood pressure) 04/11/2013  . Infection and inflammatory reaction due to internal prosthetic device, implant, and graft 03/20/2013  . Acquired spondylolisthesis 10/25/2012  . Degeneration of intervertebral disc of lumbosacral region 07/11/2012   . Lumbar canal stenosis 06/20/2012  . Calf muscle weakness 05/31/2012  . Difficulty in walking 05/31/2012  . Thoracic and lumbosacral neuritis 05/31/2012  . 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 [place], [place], [address] Phone: [phone]   Fax:  [phone]  Name: Alexis MannsLinda C Vaughan MRN: [id number] Date of Birth: 03/01/1951

## 2017-06-28 ENCOUNTER — Encounter: Payer: Self-pay | Admitting: Physical Therapy

## 2017-06-28 ENCOUNTER — Ambulatory Visit: Payer: Medicare Other | Admitting: Physical Therapy

## 2017-06-28 DIAGNOSIS — M6281 Muscle weakness (generalized): Secondary | ICD-10-CM

## 2017-06-28 DIAGNOSIS — R262 Difficulty in walking, not elsewhere classified: Secondary | ICD-10-CM | POA: Diagnosis not present

## 2017-06-28 DIAGNOSIS — M25552 Pain in left hip: Secondary | ICD-10-CM

## 2017-06-28 DIAGNOSIS — G8929 Other chronic pain: Secondary | ICD-10-CM

## 2017-06-28 DIAGNOSIS — M545 Low back pain, unspecified: Secondary | ICD-10-CM

## 2017-06-28 DIAGNOSIS — R2681 Unsteadiness on feet: Secondary | ICD-10-CM

## 2017-06-28 DIAGNOSIS — M25551 Pain in right hip: Secondary | ICD-10-CM

## 2017-06-28 NOTE — Therapy (Signed)
[facility] [address] [place], [place], [address] Phone: [phone]   Fax:  [phone]  Physical Therapy Treatment  Patient Details  Name: Alexis MannsLinda C Intrieri MRN: [id number] Date of Birth: [birth year] Referring Provider: Altamese CabalMaurice Jones, PA-C  Encounter Date: 06/28/2017      PT End of Session - 06/28/17 1555    Visit Number 5   Number of Visits 8   Date for PT Re-Evaluation 07/12/17   Authorization - Visit Number 5   Authorization - Number of Visits 10   PT Start Time 1348   PT Stop Time 1427   PT Time Calculation (min) 39 min   Equipment Utilized During Treatment Gait belt   Activity Tolerance Patient tolerated treatment well;No increased pain   Behavior During Therapy WFL for tasks assessed/performed      Past Medical History:  Diagnosis Date  . Acute renal failure (ARF) (HCC)   . Arthritis   . Bruises easily   . Colitis   . Depression   . Diabetes mellitus without complication (HCC)   . DVT (deep venous thrombosis) (HCC)   . Dyspnea   . H/O blood clots   . Heart murmur   . Hypercholesteremia   . Hypertension   . Poor circulation   . Swelling   . Ulcerative colitis [facility]HCC)     Past Surgical History:  Procedure Laterality Date  . amputaed toe    . ANKLE FRACTURE SURGERY    . BACK SURGERY    . BREAST BIOPSY Right    stereo/clip- neg  . COLONOSCOPY WITH PROPOFOL N/A 04/25/2017   Procedure: COLONOSCOPY WITH PROPOFOL;  Surgeon: Scot JunElliott, Robert T, MD;  [facility];  Service: Endoscopy;  Laterality: N/A;  . EYE SURGERY    . LAMINECTOMY    . POSTERIOR FUSION LUMBAR SPINE    . removal hip posthesis    . TONSILLECTOMY    . TOTAL HIP ARTHROPLASTY    . TUBAL LIGATION      There were no vitals filed for this visit.      Subjective Assessment - 06/28/17 1554    Subjective Pt reports 0/10 pain in her low back and hip. She denies recent falls and states she wants to work on exercises for her arms.    Pertinent History Pt. had a R hip replacement approx 5 years ago and states she was doing well until she woke up one morning "delirious" secondary to the THA becoming infected. Pt. was hospitalized and eventually received a new THA approx 6 weeks later. Both hips cause her pain. She also has a hx of spinal stenosis, but believes most of her back pain is secondary to her hip pain.    Limitations Lifting;Standing;Walking;House hold activities   How long can you sit comfortably? unlimited   How long can you stand comfortably? limited   How long can you walk comfortably? very limited secondary to pain   Diagnostic tests MRI   Patient Stated Goals pt. would like to ambulate safely and learn how to prevent falls   Currently in Pain? No/denies   Pain Score 0-No pain   Multiple Pain Sites No      Treatment:  R sidelying STM to L greater trochanter with pillow between knees. [facility]. [facility] and reports decreased tenderness Hip distraction mobilization grade III 3x30s. Pt reports no pain during or after    TherEx:  NuStep L7 x6410min (not billed) Sidelying clamshells x15. Pt  required tactile cueing of glut med muscle for correct activation Shoulder diagonals, abduction and flexion with RTB x15 BUE Elbow flexion with RTB x15 BUE  Squats in // bars x15 Resisted walking in // bars with BTB x5 forward and backward; x3 each side  UE exercises performed to increase patient's ability to perform sit to stand, and will aid patient with [facility] usage         PT Education - 06/28/17 1555    Education provided Yes   Education Details arm exercises, and continuance of LE exercises   Person(s) Educated Patient   Methods Explanation;Demonstration;Handout   Comprehension Verbalized understanding;Returned demonstration             PT Long Term Goals - 06/14/17 1710      PT LONG TERM GOAL #1   Title Pt. will report decrease in worst LBP and hip pain to <5/10 in order to improve ability  to participate in desired activities   Baseline 7/10 on 06/14/17   Time 4   Period Weeks   Status New     PT LONG TERM GOAL #2   Title Pt. will increase LEFS to >40 out of 80 to improve independence with functional mobility.     Baseline LEFS: 30 out of 80 06/14/17   Time 4   Period Weeks   Status New     PT LONG TERM GOAL #3   Title Pt. will increase Berg balance test to >50/56 to decrease fall risk/ improve independence with gait.     Baseline 45/56 on 06/14/17   Time 4   Period Weeks   Status New     PT LONG TERM GOAL #4   Title Pt. will improve gross BLE MMT t0 4+/5 in order to improve independence with gait and functional activities.    Baseline 3+/5 hip flexors, 4/5 hip abd/add and knee flex/ext 06/14/17   Time 4   Period Weeks   Status New            Plan - 06/28/17 1559    Clinical Impression Statement Pt. demonstrates decreased tenderness to palpation over L greater trochanter and reports she has to "dig around" to find the tender spot now. Pt. also demonstrates ability to tolerate side lying on L and reports she was unable to do so for a few months and is hopeful she will no longer have to have corticosteroid injection in the her L trochanteric bursa. Pt. had no increased pain at the end of tx session.   Clinical Presentation Stable   Clinical Decision Making Moderate   Rehab Potential Good   PT Frequency 2x / week   PT Duration 4 weeks   PT Treatment/Interventions ADLs/Self Care Home Management;Cryotherapy;Patient/family education;Cognitive remediation;Neuromuscular re-education;Balance training;Therapeutic exercise;Therapeutic activities;Functional mobility training;Stair training;Gait training;Manual techniques;Passive range of motion;Moist Heat;Orthotic Fit/Training   PT Next Visit Plan Issue HEP/ progress strengthening and independence with gait   PT Home Exercise Plan See HEP   Consulted and Agree with Plan of Care Patient      Patient will benefit from  skilled therapeutic intervention in order to improve the following deficits and impairments:  Abnormal gait, Decreased strength, Improper body mechanics, Postural dysfunction, Difficulty walking, Decreased knowledge of use of DME, Decreased mobility, Decreased activity tolerance, Impaired flexibility, Decreased range of motion, Decreased balance, Decreased endurance, Hypomobility, Increased fascial restricitons, Pain, Decreased safety awareness  Visit Diagnosis: Difficulty in walking, not elsewhere classified  Chronic bilateral low back pain without sciatica  Pain in  left hip  Unsteady gait  Muscle weakness (generalized)  Pain in right hip     Problem List Patient Active Problem List   Diagnosis Date Noted  . 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 Acute osteomyelitis of ankle or foot (HCC) 03/09/2016  . Recurrent major depressive disorder, in full remission (HCC) 03/09/2016  . Arthritis 02/11/2016  . Chronic pain 02/11/2016  . Clinical depression 02/11/2016  . Breathlessness on exertion 02/11/2016  . Ulcerative colitis (HCC) 02/11/2016  . H/O deep venous thrombosis 12/30/2015  . Encounter for surgical follow-up care 05/15/2015  . History of artificial joint 05/15/2015  . Type 2 diabetes mellitus (HCC) 05/12/2015  . History of operative procedure on hip 05/13/2014  . Fracture of humerus neck 04/18/2014  . Phlebitis of deep vein of lower extremity (HCC) 04/04/2014  . Hypercholesterolemia 04/11/2013  . BP (high blood pressure) 04/11/2013  . Infection and inflammatory reaction due to internal prosthetic device, implant, and graft 03/20/2013  . Acquired spondylolisthesis 10/25/2012  . Degeneration of intervertebral disc of lumbosacral region 07/11/2012  . Lumbar canal stenosis 06/20/2012  . Calf muscle weakness 05/31/2012  . Difficulty in walking 05/31/2012  . Thoracic and lumbosacral neuritis 05/31/2012  . Coxitis 04/28/2012   Cammie Mcgee, PT,  DPT # [id number] Nickola Major, SPT 06/28/2017, 4:01 PM  [place] [facility] [address] [place], [place], [address] Phone: [phone]   Fax:  [phone]  Name: LUCRETIA PENDLEY MRN: [id number] Date of Birth: May 06, 1951

## 2017-06-29 ENCOUNTER — Encounter: Payer: Self-pay | Admitting: Physical Therapy

## 2017-06-30 ENCOUNTER — Ambulatory Visit: Payer: Medicare Other | Admitting: Physical Therapy

## 2017-06-30 ENCOUNTER — Encounter: Payer: Self-pay | Admitting: Physical Therapy

## 2017-06-30 DIAGNOSIS — M25552 Pain in left hip: Secondary | ICD-10-CM

## 2017-06-30 DIAGNOSIS — M545 Low back pain: Secondary | ICD-10-CM

## 2017-06-30 DIAGNOSIS — M6281 Muscle weakness (generalized): Secondary | ICD-10-CM

## 2017-06-30 DIAGNOSIS — R2681 Unsteadiness on feet: Secondary | ICD-10-CM

## 2017-06-30 DIAGNOSIS — R262 Difficulty in walking, not elsewhere classified: Secondary | ICD-10-CM

## 2017-06-30 DIAGNOSIS — G8929 Other chronic pain: Secondary | ICD-10-CM

## 2017-06-30 NOTE — Therapy (Signed)
[facility] [address] [place], [place], [address] Phone: [phone]   Fax:  [phone]  Physical Therapy Treatment  Patient Details  Name: Alexis Vaughan MRN: [id number] Date of Birth: January 10, 1951 Referring Provider: Altamese CabalMaurice Jones, PA-C  Encounter Date: 06/30/2017      PT End of Session - 06/30/17 1713    Visit Number 6   Number of Visits 8   Date for PT Re-Evaluation 07/12/17   Authorization - Visit Number 6   Authorization - Number of Visits 10   PT Start Time 1339   PT Stop Time 1431   PT Time Calculation (min) 52 min   Equipment Utilized During Treatment --   Activity Tolerance Patient tolerated treatment well;No increased pain   Behavior During Therapy WFL for tasks assessed/performed      Past Medical History:  Diagnosis Date  . Acute renal failure (ARF) (HCC)   . Arthritis   . Bruises easily   . Colitis   . Depression   . Diabetes mellitus without complication (HCC)   . DVT (deep venous thrombosis) (HCC)   . Dyspnea   . H/O blood clots   . Heart murmur   . Hypercholesteremia   . Hypertension   . Poor circulation   . Swelling   . Ulcerative colitis [facility](HCC)     Past Surgical History:  Procedure Laterality Date  . amputaed toe    . ANKLE FRACTURE SURGERY    . BACK SURGERY    . BREAST BIOPSY Right    stereo/clip- neg  . COLONOSCOPY WITH PROPOFOL N/A 04/25/2017   Procedure: COLONOSCOPY WITH PROPOFOL;  Surgeon: Scot JunElliott, Robert T, MD;  [facility];  Service: Endoscopy;  Laterality: N/A;  . EYE SURGERY    . LAMINECTOMY    . POSTERIOR FUSION LUMBAR SPINE    . removal hip posthesis    . TONSILLECTOMY    . TOTAL HIP ARTHROPLASTY    . TUBAL LIGATION      There were no vitals filed for this visit.      Subjective Assessment - 06/30/17 1712    Subjective Pt denies pain and denies recent falls. She states she tripped over a throw rug, but did not fall.   Pertinent History Pt. had a R hip  replacement approx 5 years ago and states she was doing well until she woke up one morning "delirious" secondary to the THA becoming infected. Pt. was hospitalized and eventually received a new THA approx 6 weeks later. Both hips cause her pain. She also has a hx of spinal stenosis, but believes most of her back pain is secondary to her hip pain.    Limitations Lifting;Standing;Walking;House hold activities   How long can you sit comfortably? unlimited   How long can you stand comfortably? limited   How long can you walk comfortably? very limited secondary to pain   Diagnostic tests MRI   Patient Stated Goals pt. would like to ambulate safely and learn how to prevent falls   Currently in Pain? No/denies   Pain Score 0-No pain   Multiple Pain Sites No      Ther-ex: NuStep L7 x4210min Resisted walking in // bars fwd/bwd/side/side x5 each way. Pt required cueing for proper posture and longer strides Marching in // bars x5 laps.  Step ups in // bars 10x2 switching LE Sit to stands without UE support  Reviewed HEP        PT Education - 06/30/17  1712    Education provided Yes   Education Details continuance of HEP   Person(s) Educated Patient   Methods Explanation   Comprehension Verbalized understanding             PT Long Term Goals - 06/14/17 1710      PT LONG TERM GOAL #1   Title Pt. will report decrease in worst LBP and hip pain to <5/10 in order to improve ability to participate in desired activities   Baseline 7/10 on 06/14/17   Time 4   Period Weeks   Status New     PT LONG TERM GOAL #2   Title Pt. will increase LEFS to >40 out of 80 to improve independence with functional mobility.     Baseline LEFS: 30 out of 80 06/14/17   Time 4   Period Weeks   Status New     PT LONG TERM GOAL #3   Title Pt. will increase Berg balance test to >50/56 to decrease fall risk/ improve independence with gait.     Baseline 45/56 on 06/14/17   Time 4   Period Weeks   Status New      PT LONG TERM GOAL #4   Title Pt. will improve gross BLE MMT t0 4+/5 in order to improve independence with gait and functional activities.    Baseline 3+/5 hip flexors, 4/5 hip abd/add and knee flex/ext 06/14/17   Time 4   Period Weeks   Status New               Plan - 06/30/17 1714    Clinical Impression Statement Pt tolerated increased duration of strengthening exercises, but did required 3 seated rest breaks. Pt continues to demonstrate decrease in LBP and hip pain, and has reported increased ability to participate in desired activities. Pt continues to require cueing on correct posture and taking longer strides during resisted walking exercise in // bars. Pt was educated on plan of care and reassessment of goals next week,   Clinical Presentation Stable   Clinical Decision Making Moderate   Rehab Potential Good   PT Frequency 2x / week   PT Duration 4 weeks   PT Treatment/Interventions ADLs/Self Care Home Management;Cryotherapy;Patient/family education;Cognitive remediation;Neuromuscular re-education;Balance training;Therapeutic exercise;Therapeutic activities;Functional mobility training;Stair training;Gait training;Manual techniques;Passive range of motion;Moist Heat;Orthotic Fit/Training   PT Next Visit Plan Reassess goals next tx. session.     PT Home Exercise Plan See HEP   Consulted and Agree with Plan of Care Patient      Patient will benefit from skilled therapeutic intervention in order to improve the following deficits and impairments:  Abnormal gait, Decreased strength, Improper body mechanics, Postural dysfunction, Difficulty walking, Decreased knowledge of use of DME, Decreased mobility, Decreased activity tolerance, Impaired flexibility, Decreased range of motion, Decreased balance, Decreased endurance, Hypomobility, Increased fascial restricitons, Pain, Decreased safety awareness  Visit Diagnosis: Difficulty in walking, not elsewhere classified  Chronic  bilateral low back pain without sciatica  Muscle weakness (generalized)  Unsteady gait  Pain in left hip     Problem List Patient Active Problem List   Diagnosis Date Noted  . 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 Acute osteomyelitis of ankle or foot (HCC) 03/09/2016  . Recurrent major depressive disorder, in full remission (HCC) 03/09/2016  . Arthritis 02/11/2016  . Chronic pain 02/11/2016  . Clinical depression 02/11/2016  . Breathlessness on exertion 02/11/2016  . Ulcerative colitis (HCC) 02/11/2016  . H/O deep  venous thrombosis 12/30/2015  . Encounter for surgical follow-up care 05/15/2015  . History of artificial joint 05/15/2015  . Type 2 diabetes mellitus (HCC) 05/12/2015  . History of operative procedure on hip 05/13/2014  . Fracture of humerus neck 04/18/2014  . Phlebitis of deep vein of lower extremity (HCC) 04/04/2014  . Hypercholesterolemia 04/11/2013  . BP (high blood pressure) 04/11/2013  . Infection and inflammatory reaction due to internal prosthetic device, implant, and graft 03/20/2013  . Acquired spondylolisthesis 10/25/2012  . Degeneration of intervertebral disc of lumbosacral region 07/11/2012  . Lumbar canal stenosis 06/20/2012  . Calf muscle weakness 05/31/2012  . Difficulty in walking 05/31/2012  . Thoracic and lumbosacral neuritis 05/31/2012  . 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 [place], [place], [address] Phone: [phone]   Fax:  [phone]  Name: ERON GOBLE MRN: [id number] Date of Birth: August 11, 1951

## 2017-07-05 ENCOUNTER — Encounter: Payer: Self-pay | Admitting: Physical Therapy

## 2017-07-05 ENCOUNTER — Ambulatory Visit: Payer: Medicare Other | Admitting: Physical Therapy

## 2017-07-05 DIAGNOSIS — R2681 Unsteadiness on feet: Secondary | ICD-10-CM

## 2017-07-05 DIAGNOSIS — M545 Low back pain, unspecified: Secondary | ICD-10-CM

## 2017-07-05 DIAGNOSIS — M6281 Muscle weakness (generalized): Secondary | ICD-10-CM

## 2017-07-05 DIAGNOSIS — M25552 Pain in left hip: Secondary | ICD-10-CM

## 2017-07-05 DIAGNOSIS — G8929 Other chronic pain: Secondary | ICD-10-CM

## 2017-07-05 DIAGNOSIS — R262 Difficulty in walking, not elsewhere classified: Secondary | ICD-10-CM

## 2017-07-05 NOTE — Therapy (Signed)
[facility] [address] [place], [place], [address] Phone: [phone]   Fax:  [phone]  Physical Therapy Treatment  Patient Details  Name: Alexis Vaughan MRN: [id number] Date of Birth: 08/28/1951 Referring Provider: Altamese Cabal, PA-C  Encounter Date: 07/05/2017      PT End of Session - 07/05/17 1739    Visit Number 7   Number of Visits 8   Date for PT Re-Evaluation 07/12/17   Authorization - Visit Number 7   Authorization - Number of Visits 10   PT Start Time 1342   PT Stop Time 1424   PT Time Calculation (min) 42 min   Equipment Utilized During Treatment Gait belt   Activity Tolerance Patient tolerated treatment well;No increased pain   Behavior During Therapy WFL for tasks assessed/performed      Past Medical History:  Diagnosis Date  . Acute renal failure (ARF) (HCC)   . Arthritis   . Bruises easily   . Colitis   . Depression   . Diabetes mellitus without complication (HCC)   . DVT (deep venous thrombosis) (HCC)   . Dyspnea   . H/O blood clots   . Heart murmur   . Hypercholesteremia   . Hypertension   . Poor circulation   . Swelling   . Ulcerative colitis [facility])     Past Surgical History:  Procedure Laterality Date  . amputaed toe    . ANKLE FRACTURE SURGERY    . BACK SURGERY    . BREAST BIOPSY Right    stereo/clip- neg  . COLONOSCOPY WITH PROPOFOL N/A 04/25/2017   Procedure: COLONOSCOPY WITH PROPOFOL;  Surgeon: Scot Jun, MD;  Location: [facility] ENDOSCOPY;  Service: Endoscopy;  Laterality: N/A;  . EYE SURGERY    . LAMINECTOMY    . POSTERIOR FUSION LUMBAR SPINE    . removal hip posthesis    . TONSILLECTOMY    . TOTAL HIP ARTHROPLASTY    . TUBAL LIGATION      There were no vitals filed for this visit.      Subjective Assessment - 07/05/17 1738    Subjective Pt reports 0/10 pain and denies recent falls. Pt reports increased ability to participate in desired activities and reports she is  feeling confident with SPC in L hand, but uses SPC in R hand for unstable surfaces and when she is fatigued.   Pertinent History Pt. had a R hip replacement approx 5 years ago and states she was doing well until she woke up one morning "delirious" secondary to the THA becoming infected. Pt. was hospitalized and eventually received a new THA approx 6 weeks later. Both hips cause her pain. She also has a hx of spinal stenosis, but believes most of her back pain is secondary to her hip pain.    Limitations Lifting;Standing;Walking;House hold activities   How long can you sit comfortably? unlimited   How long can you stand comfortably? limited   How long can you walk comfortably? very limited secondary to pain   Diagnostic tests MRI   Patient Stated Goals pt. would like to ambulate safely and learn how to prevent falls   Currently in Pain? No/denies   Pain Score 0-No pain   Multiple Pain Sites No     TherEx:  Walking in hallway with cueing on proper heel strike and correct posture Step ups to 6in stool fwd/side/side x15 each way  Sidelying clamshells x15 BLE. Pt educated on proper hip placement  and activation of glut med Supine bridging x10  Marching in // bars x15 BLE  Squatting in // bars x15 BLE. Pt educated on proper knee alignment 3 way hip x15 BLE. Pt required extensive education on proper exercise technique; pt required verbal and tactile cueing, as well as demonstration for correct hip, knee, and foot placement during exercise Resisted walking with BTB fwd/bwd/side/side in // bars x3 laps each side        PT Education - 07/05/17 1739    Education provided Yes   Education Details proper 3 way hip technique   Person(s) Educated Patient   Methods Explanation;Demonstration;Tactile cues;Verbal cues   Comprehension Verbalized understanding;Returned demonstration;Verbal cues required;Tactile cues required             PT Long Term Goals - 06/14/17 1710      PT LONG TERM GOAL  #1   Title Pt. will report decrease in worst LBP and hip pain to <5/10 in order to improve ability to participate in desired activities   Baseline 7/10 on 06/14/17   Time 4   Period Weeks   Status New     PT LONG TERM GOAL #2   Title Pt. will increase LEFS to >40 out of 80 to improve independence with functional mobility.     Baseline LEFS: 30 out of 80 06/14/17   Time 4   Period Weeks   Status New     PT LONG TERM GOAL #3   Title Pt. will increase Berg balance test to >50/56 to decrease fall risk/ improve independence with gait.     Baseline 45/56 on 06/14/17   Time 4   Period Weeks   Status New     PT LONG TERM GOAL #4   Title Pt. will improve gross BLE MMT t0 4+/5 in order to improve independence with gait and functional activities.    Baseline 3+/5 hip flexors, 4/5 hip abd/add and knee flex/ext 06/14/17   Time 4   Period Weeks   Status New               Plan - 07/05/17 1742    Clinical Impression Statement Pt continues to demonstrate increased tolerance of strengthening exercises and less fatigue is noted via pt requiring fewer rest breaks. Pt required extensive education on proper exercise technique, especially 3 way hip exercise in // bars. Pt required verbal and tactile cueing, as well as demonstration for proper hip, knee, and foot alignment. Pt also required verbal cueing to ensure heel strike and correct posture during ambulation.    Clinical Presentation Stable   Clinical Decision Making Moderate   Rehab Potential Good   PT Frequency 2x / week   PT Duration 4 weeks   PT Treatment/Interventions ADLs/Self Care Home Management;Cryotherapy;Patient/family education;Cognitive remediation;Neuromuscular re-education;Balance training;Therapeutic exercise;Therapeutic activities;Functional mobility training;Stair training;Gait training;Manual techniques;Passive range of motion;Moist Heat;Orthotic Fit/Training   PT Next Visit Plan Reassess goals next tx. session.     PT Home  Exercise Plan See HEP   Consulted and Agree with Plan of Care Patient      Patient will benefit from skilled therapeutic intervention in order to improve the following deficits and impairments:  Abnormal gait, Decreased strength, Improper body mechanics, Postural dysfunction, Difficulty walking, Decreased knowledge of use of DME, Decreased mobility, Decreased activity tolerance, Impaired flexibility, Decreased range of motion, Decreased balance, Decreased endurance, Hypomobility, Increased fascial restricitons, Pain, Decreased safety awareness  Visit Diagnosis: Difficulty in walking, not elsewhere classified  Chronic bilateral low back  pain without sciatica  Muscle weakness (generalized)  Unsteady gait  Pain in left hip     Problem List Patient Active Problem List   Diagnosis Date Noted  . 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 Acute osteomyelitis of ankle or foot (HCC) 03/09/2016  . Recurrent major depressive disorder, in full remission (HCC) 03/09/2016  . Arthritis 02/11/2016  . Chronic pain 02/11/2016  . Clinical depression 02/11/2016  . Breathlessness on exertion 02/11/2016  . Ulcerative colitis (HCC) 02/11/2016  . H/O deep venous thrombosis 12/30/2015  . Encounter for surgical follow-up care 05/15/2015  . History of artificial joint 05/15/2015  . Type 2 diabetes mellitus (HCC) 05/12/2015  . History of operative procedure on hip 05/13/2014  . Fracture of humerus neck 04/18/2014  . Phlebitis of deep vein of lower extremity (HCC) 04/04/2014  . Hypercholesterolemia 04/11/2013  . BP (high blood pressure) 04/11/2013  . Infection and inflammatory reaction due to internal prosthetic device, implant, and graft 03/20/2013  . Acquired spondylolisthesis 10/25/2012  . Degeneration of intervertebral disc of lumbosacral region 07/11/2012  . Lumbar canal stenosis 06/20/2012  . Calf muscle weakness 05/31/2012  . Difficulty in walking 05/31/2012  . Thoracic  and lumbosacral neuritis 05/31/2012  . 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 [place], [place], [address] Phone: [phone]   Fax:  [phone]  Name: Starling MannsLinda C Greenstein MRN: [id number] Date of Birth: May 16, 1951

## 2017-07-07 ENCOUNTER — Encounter: Payer: Medicare Other | Admitting: Physical Therapy

## 2017-07-07 ENCOUNTER — Encounter: Payer: Self-pay | Admitting: Physical Therapy

## 2017-07-07 ENCOUNTER — Ambulatory Visit: Payer: Medicare Other | Attending: Orthopedic Surgery

## 2017-07-07 DIAGNOSIS — G8929 Other chronic pain: Secondary | ICD-10-CM

## 2017-07-07 DIAGNOSIS — M6281 Muscle weakness (generalized): Secondary | ICD-10-CM

## 2017-07-07 DIAGNOSIS — M545 Low back pain, unspecified: Secondary | ICD-10-CM

## 2017-07-07 DIAGNOSIS — R262 Difficulty in walking, not elsewhere classified: Secondary | ICD-10-CM | POA: Diagnosis present

## 2017-07-07 NOTE — Therapy (Signed)
[place] [facility] [address] [place], [place], [address] Phone: [phone]   Fax:  [phone]  Physical Therapy Treatment/Progress Note  Patient Details  Name: Alexis Vaughan MRN: [id number] Date of Birth: 12-19-1950 Referring Provider: Altamese CabalMaurice Jones, PA-C  Encounter Date: 07/07/2017      PT End of Session - 07/07/17 1308    Visit Number 8   Number of Visits 16   Date for PT Re-Evaluation 09/01/17   Authorization - Visit Number 8   Authorization - Number of Visits 18   PT Start Time 1315   PT Stop Time 1345   PT Time Calculation (min) 30 min   Equipment Utilized During Treatment Gait belt   Activity Tolerance Patient tolerated treatment well;No increased pain   Behavior During Therapy WFL for tasks assessed/performed      Past Medical History:  Diagnosis Date  . Acute renal failure (ARF) (HCC)   . Arthritis   . Bruises easily   . Colitis   . Depression   . Diabetes mellitus without complication (HCC)   . DVT (deep venous thrombosis) (HCC)   . Dyspnea   . H/O blood clots   . Heart murmur   . Hypercholesteremia   . Hypertension   . Poor circulation   . Swelling   . Ulcerative colitis [facility](HCC)     Past Surgical History:  Procedure Laterality Date  . amputaed toe    . ANKLE FRACTURE SURGERY    . BACK SURGERY    . BREAST BIOPSY Right    stereo/clip- neg  . COLONOSCOPY WITH PROPOFOL N/A 04/25/2017   Procedure: COLONOSCOPY WITH PROPOFOL;  Surgeon: Scot JunElliott, Robert T, MD;  [facility];  Service: Endoscopy;  Laterality: N/A;  . EYE SURGERY    . LAMINECTOMY    . POSTERIOR FUSION LUMBAR SPINE    . removal hip posthesis    . TONSILLECTOMY    . TOTAL HIP ARTHROPLASTY    . TUBAL LIGATION      There were no vitals filed for this visit.      Subjective Assessment - 07/07/17 1308    Subjective Pt reports she is doing well on this date. She denies any hip pain currently. She is performing HEP and reports  improvement in her ability to properly perform hip extension. Pt would like to attempt independent continuation of exercises for the next two weeks and then return for a reassessment.    Pertinent History Pt. had a R hip replacement approx 5 years ago and states she was doing well until she woke up one morning "delirious" secondary to the THA becoming infected. Pt. was hospitalized and eventually received a new THA approx 6 weeks later. Both hips cause her pain. She also has a hx of spinal stenosis, but believes most of her back pain is secondary to her hip pain.    Limitations Lifting;Standing;Walking;House hold activities   How long can you sit comfortably? unlimited   How long can you stand comfortably? limited   How long can you walk comfortably? very limited secondary to pain   Diagnostic tests MRI   Patient Stated Goals pt. would like to ambulate safely and learn how to prevent falls   Currently in Pain? No/denies          TREATMENT  Ther-ex Pt completed LEFS (41/80, unbilled); Performed BERG (49/56), MMT (see above), and pain assessment with patient; Updated goals and discussed plan of care with patient; 3 way hip  x 10 BLE, L hip flexion only 5 secondary to weakness. Pt required extensive education on proper exercise technique especially to avoid L hip external rotation. Significant L hip abduction weakness noted;                          PT Education - 07/07/17 1308    Education provided Yes   Education Details Outcome measures, goals, plan of care, HEP, recert   Person(s) Educated Patient   Methods Explanation;Demonstration   Comprehension Verbalized understanding;Returned demonstration             PT Long Term Goals - 07/07/17 1309      PT LONG TERM GOAL #1   Title Pt. will report decrease in worst LBP and hip pain to <5/10 in order to improve ability to participate in desired activities   Baseline 7/10 on 06/14/17; 07/07/17: worst: 6/10   Time  4   Period Weeks   Status On-going     PT LONG TERM GOAL #2   Title Pt. will increase LEFS to >40 out of 80 to improve independence with functional mobility.     Baseline LEFS: 30 out of 80 06/14/17, 07/07/17: 41/80   Time 4   Period Weeks   Status Achieved     PT LONG TERM GOAL #3   Title Pt. will increase Berg balance test to >50/56 to decrease fall risk/ improve independence with gait.     Baseline 45/56 on 06/14/17; 07/07/17: 49/56   Time 4   Period Weeks   Status On-going     PT LONG TERM GOAL #4   Title Pt. will improve gross BLE MMT to 4+/5 in order to improve independence with gait and functional activities.    Baseline 3+/5 hip flexors, 4/5 hip abd/add and knee flex/ext 06/14/17, 07/07/17: see flowsheet   Time 4   Period Weeks   Status On-going               Plan - 07/07/17 1309    Clinical Impression Statement Pt arrived late for her appointment and then had to complete self-report outcome measure which shortened session today. She demonstrates improvement in her outcome measures on this date. LEFS increased to 41/80 and BERG improved to 49/56. She continues to demonstrate bilateral LE weakness especially with hip abduction. She reports 100% improvement in L lateral hip pain since starting therapy but still reports significant flaring of pain when it is at its worst. Pt would like to continue independently for the next 2 weeks and then return for a reassessment to see how she is progressing. Will recert patient today and pending progress after 2 weeks may discharge or continue therapy at frequency of 1x/wk for an additional 6 weeks.     Clinical Presentation Stable   Clinical Decision Making Moderate   Rehab Potential Good   PT Frequency 1x / week   PT Duration 8 weeks   PT Treatment/Interventions ADLs/Self Care Home Management;Cryotherapy;Patient/family education;Cognitive remediation;Neuromuscular re-education;Balance training;Therapeutic exercise;Therapeutic  activities;Functional mobility training;Stair training;Gait training;Manual techniques;Passive range of motion;Moist Heat;Orthotic Fit/Training   PT Next Visit Plan Reassess progress after 2 weeks of independent exercise   PT Home Exercise Plan See HEP   Consulted and Agree with Plan of Care Patient      Patient will benefit from skilled therapeutic intervention in order to improve the following deficits and impairments:  Abnormal gait, Decreased strength, Improper body mechanics, Postural dysfunction, Difficulty walking, Decreased knowledge of  use of DME, Decreased mobility, Decreased activity tolerance, Impaired flexibility, Decreased range of motion, Decreased balance, Decreased endurance, Hypomobility, Increased fascial restricitons, Pain, Decreased safety awareness  Visit Diagnosis: Difficulty in walking, not elsewhere classified - Plan: PT plan of care cert/re-cert  Chronic bilateral low back pain without sciatica - Plan: PT plan of care cert/re-cert  Muscle weakness (generalized) - Plan: PT plan of care cert/re-cert       G-Codes - July 26, 2017 May 03, 2047    Functional Assessment Tool Used (Outpatient Only) Clinical impression/ LEFS/ gait difficult/MMT/NPRS/ Berg   Functional Limitation Mobility: Walking and moving around   Mobility: Walking and Moving Around Current Status [phone]) At least 20 percent but less than 40 percent impaired, limited or restricted   Mobility: Walking and Moving Around Goal Status [phone]) At least 1 percent but less than 20 percent impaired, limited or restricted      Problem List Patient Active Problem List   Diagnosis Date Noted  . 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 Acute osteomyelitis of ankle or foot (HCC) 03/09/2016  . Recurrent major depressive disorder, in full remission (HCC) 03/09/2016  . Arthritis 02/11/2016  . Chronic pain 02/11/2016  . Clinical depression 02/11/2016  . Breathlessness on exertion 02/11/2016  .  Ulcerative colitis (HCC) 02/11/2016  . H/O deep venous thrombosis 12/30/2015  . Encounter for surgical follow-up care 05/15/2015  . History of artificial joint 05/15/2015  . Type 2 diabetes mellitus (HCC) 05/12/2015  . History of operative procedure on hip 05/13/2014  . Fracture of humerus neck 04/18/2014  . Phlebitis of deep vein of lower extremity (HCC) 04/04/2014  . Hypercholesterolemia 04/11/2013  . BP (high blood pressure) 04/11/2013  . Infection and inflammatory reaction due to internal prosthetic device, implant, and graft 03/20/2013  . Acquired spondylolisthesis 10/25/2012  . Degeneration of intervertebral disc of lumbosacral region 07/11/2012  . Lumbar canal stenosis 06/20/2012  . Calf muscle weakness 05/31/2012  . Difficulty in walking 05/31/2012  . Thoracic and lumbosacral neuritis 05/31/2012  . Coxitis 04/28/2012   Lynnea Maizes PT, DPT   Huprich,Jason July 26, 2017, 8:51 PM  [place] [facility] [address] [place], [place], [address] Phone: [phone]   Fax:  [phone]  Name: Alexis Vaughan MRN: [id number] Date of Birth: [birth year]

## 2017-07-27 ENCOUNTER — Encounter: Payer: Medicare Other | Admitting: Physical Therapy

## 2017-08-04 ENCOUNTER — Other Ambulatory Visit: Payer: Self-pay | Admitting: Orthopedic Surgery

## 2017-08-04 DIAGNOSIS — M5416 Radiculopathy, lumbar region: Secondary | ICD-10-CM

## 2017-08-10 ENCOUNTER — Ambulatory Visit: Payer: Medicare Other

## 2017-08-11 ENCOUNTER — Ambulatory Visit
Admission: RE | Admit: 2017-08-11 | Discharge: 2017-08-11 | Disposition: A | Discharge status: Home / Self Care | Payer: Medicare Other | Source: Ambulatory Visit | Attending: Orthopedic Surgery | Admitting: Orthopedic Surgery

## 2017-08-11 DIAGNOSIS — M4316 Spondylolisthesis, lumbar region: Secondary | ICD-10-CM | POA: Insufficient documentation

## 2017-08-11 DIAGNOSIS — M5416 Radiculopathy, lumbar region: Secondary | ICD-10-CM

## 2017-08-11 DIAGNOSIS — M48061 Spinal stenosis, lumbar region without neurogenic claudication: Secondary | ICD-10-CM | POA: Insufficient documentation

## 2017-08-11 DIAGNOSIS — M5127 Other intervertebral disc displacement, lumbosacral region: Secondary | ICD-10-CM | POA: Diagnosis not present

## 2017-12-26 ENCOUNTER — Other Ambulatory Visit: Payer: Self-pay | Admitting: Neurology

## 2017-12-26 DIAGNOSIS — R278 Other lack of coordination: Secondary | ICD-10-CM

## 2018-01-04 ENCOUNTER — Ambulatory Visit: Payer: Medicare Other

## 2018-01-05 ENCOUNTER — Ambulatory Visit
Admission: RE | Admit: 2018-01-05 | Discharge: 2018-01-05 | Disposition: A | Discharge status: Home / Self Care | Payer: Medicare Other | Source: Ambulatory Visit | Attending: Neurology | Admitting: Neurology

## 2018-01-05 DIAGNOSIS — Z87820 Personal history of traumatic brain injury: Secondary | ICD-10-CM | POA: Diagnosis not present

## 2018-01-05 DIAGNOSIS — I6782 Cerebral ischemia: Secondary | ICD-10-CM | POA: Diagnosis not present

## 2018-01-05 DIAGNOSIS — R278 Other lack of coordination: Secondary | ICD-10-CM | POA: Insufficient documentation

## 2018-01-05 DIAGNOSIS — I6389 Other cerebral infarction: Secondary | ICD-10-CM | POA: Diagnosis not present

## 2018-01-05 DIAGNOSIS — G253 Myoclonus: Secondary | ICD-10-CM | POA: Insufficient documentation

## 2018-01-05 DIAGNOSIS — R296 Repeated falls: Secondary | ICD-10-CM | POA: Diagnosis not present

## 2018-05-06 DEATH — deceased

## 2018-05-17 ENCOUNTER — Ambulatory Visit: Payer: Medicare Other | Admitting: Podiatry

## 2019-05-16 IMAGING — MR MR HEAD W/O CM
10 series · 48 of 48 positions shown · non-contrast
Comparison: Brain MRI 01/18/2013

CLINICAL DATA: Asterixis and falls. Rule out TBI or subdural
hematoma.

EXAM:
MRI HEAD WITHOUT CONTRAST
TECHNIQUE: Multiplanar, multiecho pulse sequences of the brain and surrounding
structures were obtained without intravenous contrast.

[Series 4: T1 · sagittal · 5.0mm · 0.45mm/px · 3 of 24 slices shown (1 of 2)]
[im 1/24]
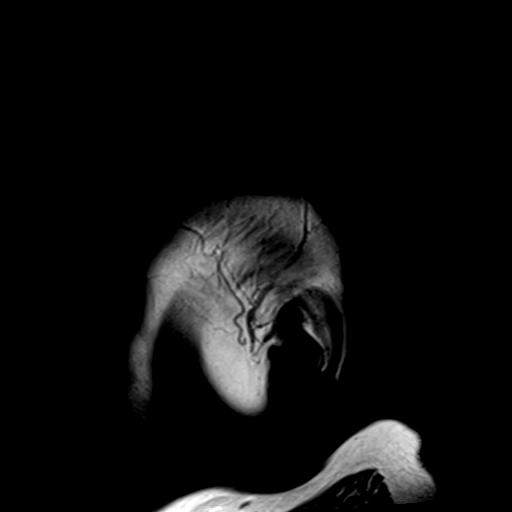
[im 12/24]
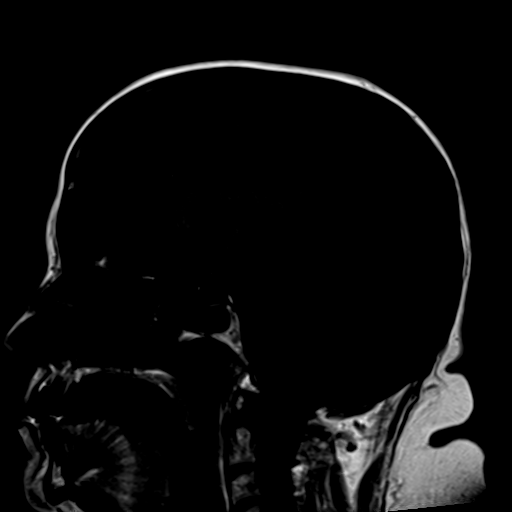
[im 24/24]
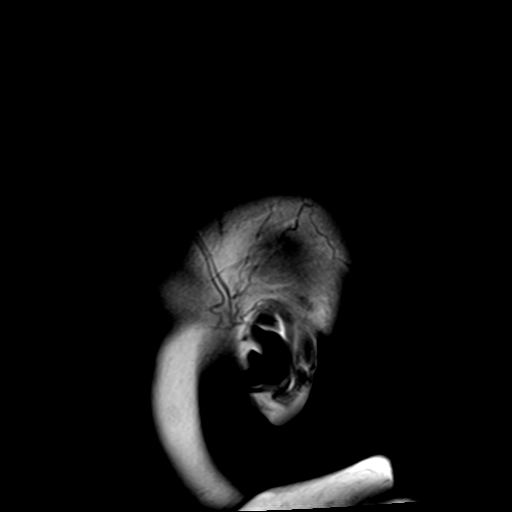

[Series 9: T2 · coronal · 5.0mm · 0.45mm/px · 3 of 29 slices shown (1 of 3)]
[im 1/29]
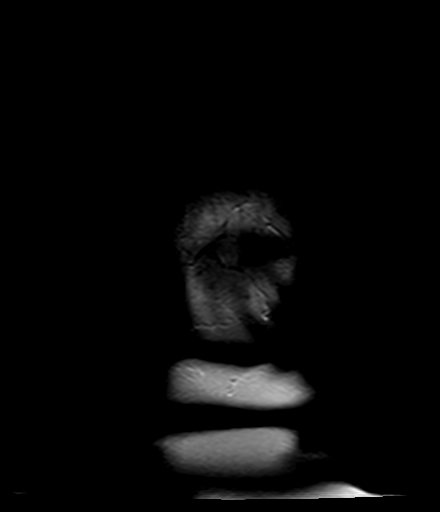
[im 15/29]
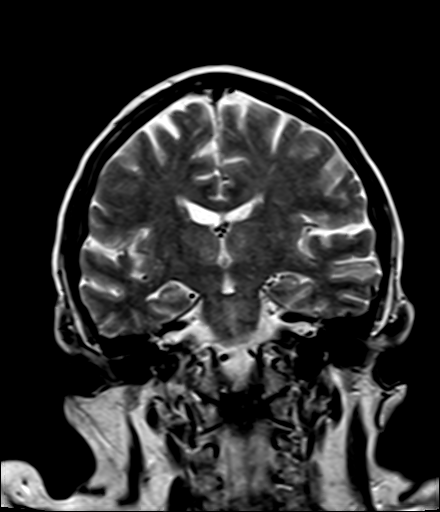
[im 29/29]
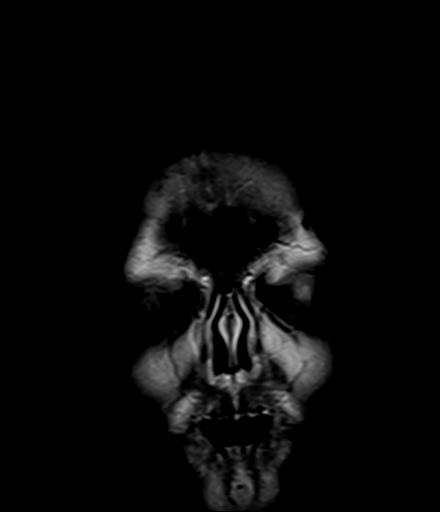

[Series 11: DWI · coronal · 3.0mm · 1.20mm/px · 4 of 47 slices shown (1 of 3)]
[im 1/47]
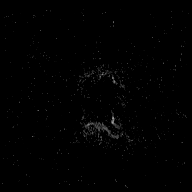
[im 16/47]
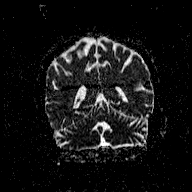
[im 31/47]
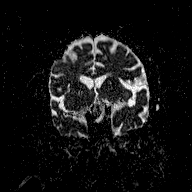
[im 47/47]
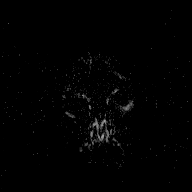

[Series 101: DWI · axial · 3.0mm · 1.20mm/px · z∈[-67,+93]mm · 5 of 55 slices shown (2 of 3)]
[im 1/55]
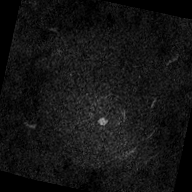
[im 14/55]
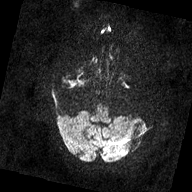
[im 28/55]
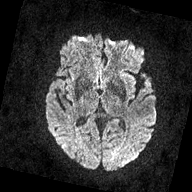
[im 41/55]
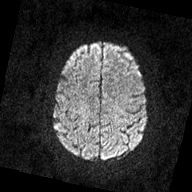
[im 55/55]
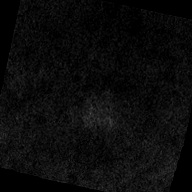

[Series 102: ADC · axial · 3.0mm · 1.20mm/px · z∈[-67,+93]mm · 5 of 54 slices shown]
[im 1/54]
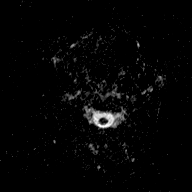
[im 14/54]
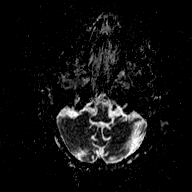
[im 27/54]
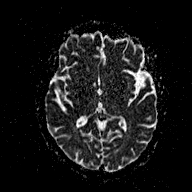
[im 40/54]
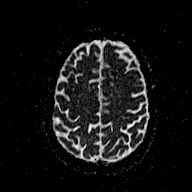
[im 54/54]
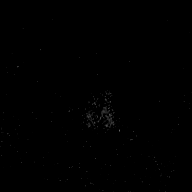

[Series 103: T2 · axial · 5.0mm · 0.72mm/px · z∈[-76,+90]mm · 2 of 25 slices shown (2 of 3)]
[im 1/25]
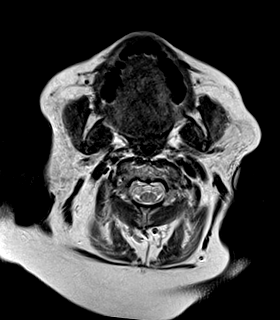
[im 25/25]
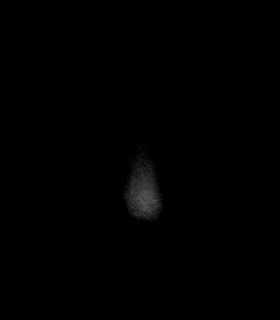

[Series 104: FLAIR · axial · 3.0mm · 0.45mm/px · z∈[-73,+87]mm · 5 of 55 slices shown]
[im 1/55]
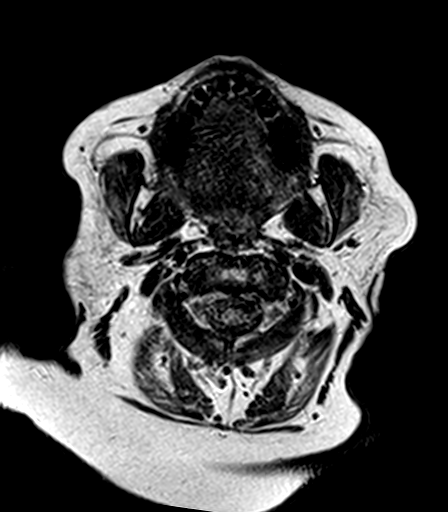
[im 14/55]
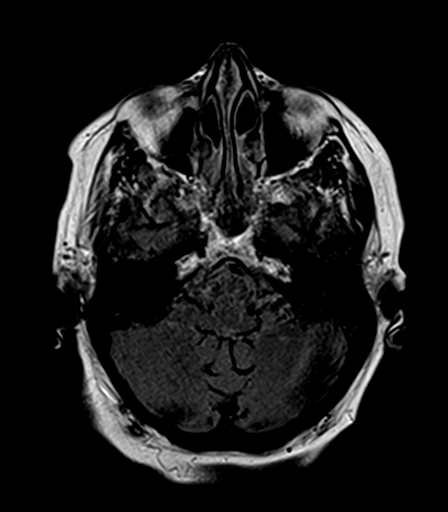
[im 28/55]
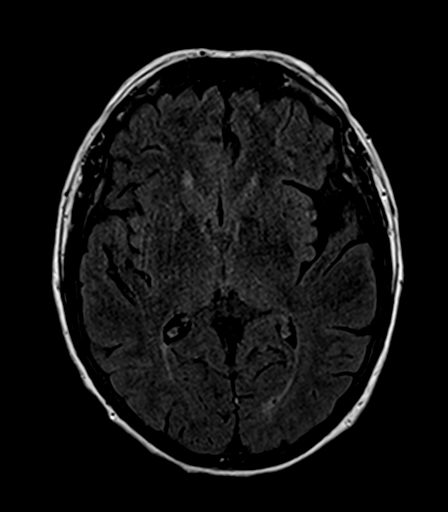
[im 41/55]
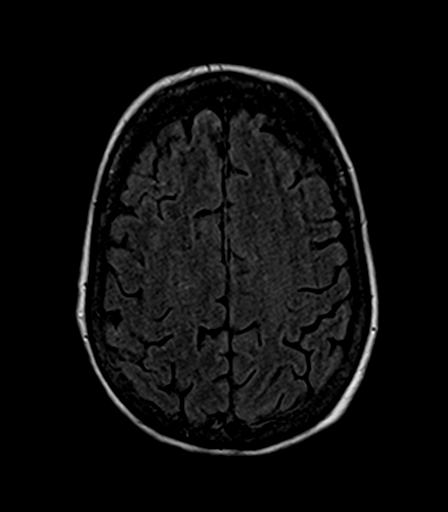
[im 55/55]
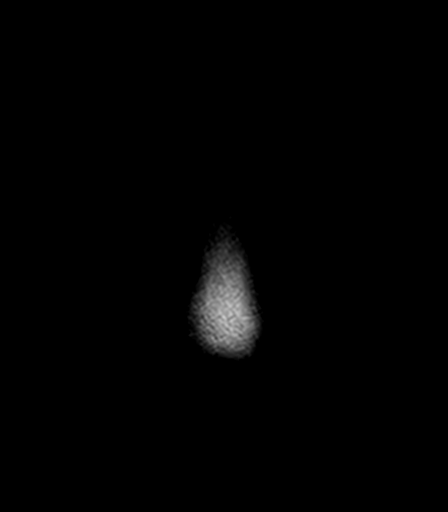

[Series 106: T2 · axial · 5.0mm · 0.72mm/px · z∈[-76,+90]mm · 2 of 25 slices shown (3 of 3)]
[im 1/25]
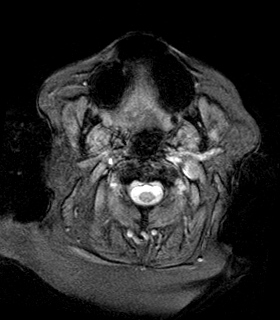
[im 25/25]
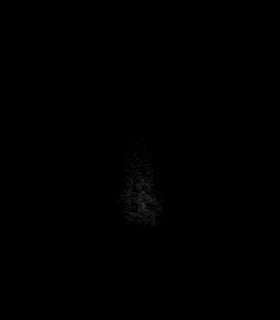

[Series 107: T1 · axial · 1.0mm · 1.00mm/px · z∈[-69,+88]mm · 15 of 160 slices shown (2 of 2)]
[im 1/160]
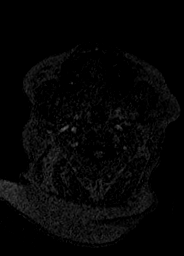
[im 12/160]
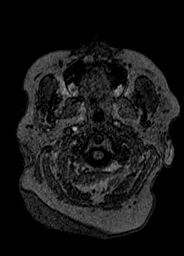
[im 23/160]
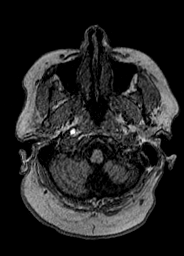
[im 35/160]
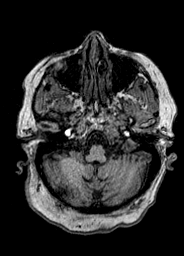
[im 46/160]
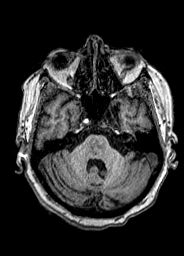
[im 57/160]
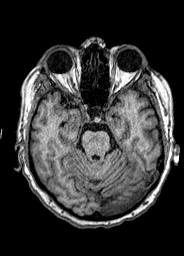
[im 69/160]
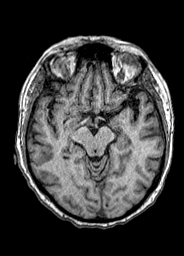
[im 80/160]
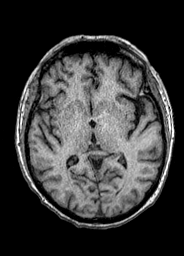
[im 91/160]
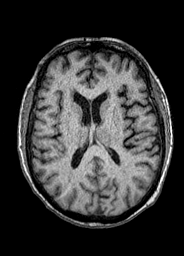
[im 103/160]
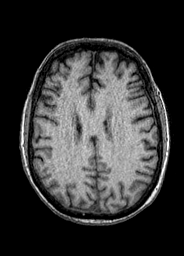
[im 114/160]
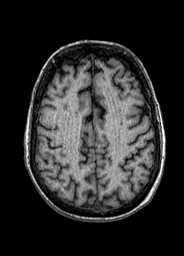
[im 125/160]
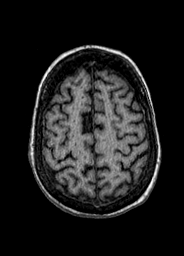
[im 137/160]
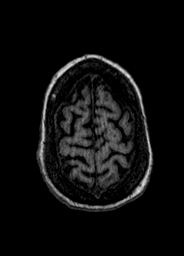
[im 148/160]
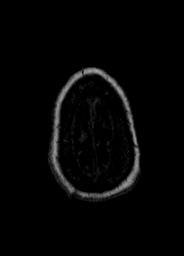
[im 160/160]
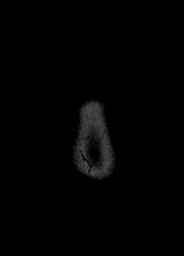

[Series 108: DWI · coronal · 3.0mm · 1.20mm/px · 4 of 46 slices shown (3 of 3)]
[im 1/46]
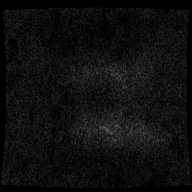
[im 16/46]
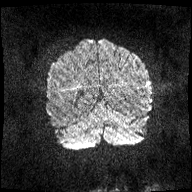
[im 31/46]
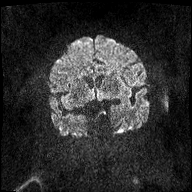
[im 46/46]
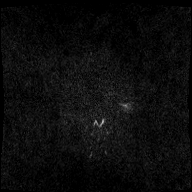

[48 of 48 positions shown; findings below may reference images not displayed]

FINDINGS: Brain: Elongated (on coronal images) DWI hyperintensity in the left
precentral gyrus which is high so intense on ADC map, likely
subacute infarcts.

Prominent indistinct T2 hyperintensity across the pons, also seen in
9750 without progression. This is usually from chronic small vessel
ischemia in this patient with multiple vascular risk factors. There
is no specific pattern for neurodegenerative disease or osmotic
demyelination. Mild periventricular FLAIR hyperintensity, also
nonspecific but usually post ischemic. Superior cerebellar volume
loss that is stable from 9750.

No hydrocephalus or masslike finding. Normal signal in the deep gray
nuclei.

Vascular: Major flow voids are preserved

Skull and upper cervical spine: Negative for marrow lesion

Sinuses/Orbits: Bilateral cataract resection.  Otherwise negative.

These results will be called to the ordering clinician or
representative by the Radiologist Assistant, and communication
documented in the PACS or zVision Dashboard.
IMPRESSION: 1. Small incidental subacute infarct in the juxtacortical left
precentral gyrus.
2. History of head trauma.  No posttraumatic finding.
3. Prominent pons and mild cerebral white matter signal abnormality,
usually chronic small vessel ischemia. These changes are similar to
4. Cerebellar volume loss without progression from 9750.
